# Patient Record
Sex: Female | Born: 1940 | Race: White | Hispanic: No | Marital: Married | Smoking: Never smoker
Health system: Southern US, Community
[De-identification: ages and names within clinical notes are randomized; demographics above are authoritative.]

## PROBLEM LIST (undated history)

## (undated) DIAGNOSIS — E7800 Pure hypercholesterolemia, unspecified: Secondary | ICD-10-CM

## (undated) DIAGNOSIS — I2510 Atherosclerotic heart disease of native coronary artery without angina pectoris: Secondary | ICD-10-CM

## (undated) DIAGNOSIS — K635 Polyp of colon: Secondary | ICD-10-CM

## (undated) DIAGNOSIS — D499 Neoplasm of unspecified behavior of unspecified site: Secondary | ICD-10-CM

## (undated) DIAGNOSIS — I10 Essential (primary) hypertension: Secondary | ICD-10-CM

## (undated) DIAGNOSIS — K219 Gastro-esophageal reflux disease without esophagitis: Secondary | ICD-10-CM

## (undated) DIAGNOSIS — K8020 Calculus of gallbladder without cholecystitis without obstruction: Secondary | ICD-10-CM

## (undated) DIAGNOSIS — R413 Other amnesia: Secondary | ICD-10-CM

## (undated) DIAGNOSIS — R599 Enlarged lymph nodes, unspecified: Secondary | ICD-10-CM

## (undated) HISTORY — DX: Gastro-esophageal reflux disease without esophagitis: K21.9

## (undated) HISTORY — PX: COLONOSCOPY: SHX174

## (undated) HISTORY — DX: Calculus of gallbladder without cholecystitis without obstruction: K80.20

## (undated) HISTORY — DX: Other amnesia: R41.3

## (undated) HISTORY — PX: CARDIAC CATHETERIZATION: SHX172

## (undated) HISTORY — DX: Neoplasm of unspecified behavior of unspecified site: D49.9

## (undated) HISTORY — PX: CORONARY STENT PLACEMENT: SHX1402

## (undated) HISTORY — DX: Atherosclerotic heart disease of native coronary artery without angina pectoris: I25.10

## (undated) HISTORY — DX: Polyp of colon: K63.5

## (undated) HISTORY — DX: Essential (primary) hypertension: I10

## (undated) HISTORY — PX: UPPER GI ENDOSCOPY: SHX6162

## (undated) HISTORY — DX: Enlarged lymph nodes, unspecified: R59.9

## (undated) HISTORY — DX: Pure hypercholesterolemia, unspecified: E78.00

---

## 1978-01-23 HISTORY — PX: ABDOMINAL HYSTERECTOMY: SHX81

## 2004-01-24 DIAGNOSIS — D499 Neoplasm of unspecified behavior of unspecified site: Secondary | ICD-10-CM

## 2004-01-24 HISTORY — DX: Neoplasm of unspecified behavior of unspecified site: D49.9

## 2007-02-20 ENCOUNTER — Ambulatory Visit: Payer: Self-pay

## 2008-01-24 HISTORY — PX: CARDIAC SURGERY: SHX584

## 2008-04-29 ENCOUNTER — Ambulatory Visit: Payer: Self-pay | Admitting: Vascular Surgery

## 2008-05-28 ENCOUNTER — Ambulatory Visit: Payer: Self-pay | Admitting: Cardiology

## 2009-01-23 HISTORY — PX: CAROTID ENDARTERECTOMY: SUR193

## 2009-06-29 ENCOUNTER — Ambulatory Visit: Payer: Self-pay | Admitting: Vascular Surgery

## 2009-07-05 ENCOUNTER — Ambulatory Visit: Payer: Self-pay | Admitting: Vascular Surgery

## 2009-07-06 ENCOUNTER — Inpatient Hospital Stay: Payer: Self-pay | Admitting: Vascular Surgery

## 2010-11-30 ENCOUNTER — Ambulatory Visit: Payer: Self-pay | Admitting: Gynecologic Oncology

## 2010-12-06 ENCOUNTER — Ambulatory Visit: Payer: Self-pay | Admitting: Emergency Medicine

## 2010-12-09 LAB — PATHOLOGY REPORT

## 2010-12-20 ENCOUNTER — Telehealth: Payer: Self-pay

## 2010-12-20 ENCOUNTER — Ambulatory Visit: Payer: Self-pay | Admitting: Gynecologic Oncology

## 2010-12-20 DIAGNOSIS — K3189 Other diseases of stomach and duodenum: Secondary | ICD-10-CM

## 2010-12-20 NOTE — Telephone Encounter (Signed)
Pt has been instructed and meds reviewed pt is currently holding her plavix she has been off for two weeks.

## 2010-12-22 ENCOUNTER — Ambulatory Visit: Payer: Self-pay

## 2010-12-22 ENCOUNTER — Telehealth: Payer: Self-pay | Admitting: Gastroenterology

## 2010-12-22 ENCOUNTER — Encounter: Payer: Self-pay | Admitting: Gastroenterology

## 2010-12-22 NOTE — Telephone Encounter (Signed)
Pt husband called and all questions were answered regarding her medications

## 2010-12-23 ENCOUNTER — Encounter: Payer: Self-pay | Admitting: Gastroenterology

## 2010-12-24 ENCOUNTER — Ambulatory Visit: Payer: Self-pay | Admitting: Gynecologic Oncology

## 2011-01-02 ENCOUNTER — Ambulatory Visit: Payer: Self-pay | Admitting: Gynecologic Oncology

## 2011-01-03 ENCOUNTER — Ambulatory Visit: Payer: Self-pay | Admitting: Gynecologic Oncology

## 2011-01-05 LAB — PATHOLOGY REPORT

## 2011-01-24 ENCOUNTER — Ambulatory Visit: Payer: Self-pay | Admitting: Gynecologic Oncology

## 2011-01-24 DIAGNOSIS — R599 Enlarged lymph nodes, unspecified: Secondary | ICD-10-CM

## 2011-01-24 DIAGNOSIS — I10 Essential (primary) hypertension: Secondary | ICD-10-CM

## 2011-01-24 HISTORY — DX: Essential (primary) hypertension: I10

## 2011-01-24 HISTORY — DX: Enlarged lymph nodes, unspecified: R59.9

## 2011-02-08 DIAGNOSIS — E782 Mixed hyperlipidemia: Secondary | ICD-10-CM | POA: Diagnosis not present

## 2011-02-08 DIAGNOSIS — I2510 Atherosclerotic heart disease of native coronary artery without angina pectoris: Secondary | ICD-10-CM | POA: Diagnosis not present

## 2011-02-08 DIAGNOSIS — I10 Essential (primary) hypertension: Secondary | ICD-10-CM | POA: Diagnosis not present

## 2011-03-07 ENCOUNTER — Ambulatory Visit: Payer: Self-pay | Admitting: Gynecologic Oncology

## 2011-03-07 DIAGNOSIS — D279 Benign neoplasm of unspecified ovary: Secondary | ICD-10-CM | POA: Diagnosis not present

## 2011-03-24 ENCOUNTER — Ambulatory Visit: Payer: Self-pay | Admitting: Gynecologic Oncology

## 2011-05-04 DIAGNOSIS — E785 Hyperlipidemia, unspecified: Secondary | ICD-10-CM | POA: Diagnosis not present

## 2011-05-04 DIAGNOSIS — E039 Hypothyroidism, unspecified: Secondary | ICD-10-CM | POA: Diagnosis not present

## 2011-05-04 DIAGNOSIS — I10 Essential (primary) hypertension: Secondary | ICD-10-CM | POA: Diagnosis not present

## 2011-05-04 DIAGNOSIS — F411 Generalized anxiety disorder: Secondary | ICD-10-CM | POA: Diagnosis not present

## 2011-05-04 DIAGNOSIS — E7800 Pure hypercholesterolemia, unspecified: Secondary | ICD-10-CM | POA: Diagnosis not present

## 2011-05-15 DIAGNOSIS — L570 Actinic keratosis: Secondary | ICD-10-CM | POA: Diagnosis not present

## 2011-05-15 DIAGNOSIS — K130 Diseases of lips: Secondary | ICD-10-CM | POA: Diagnosis not present

## 2011-06-01 DIAGNOSIS — L570 Actinic keratosis: Secondary | ICD-10-CM | POA: Diagnosis not present

## 2011-06-08 DIAGNOSIS — F329 Major depressive disorder, single episode, unspecified: Secondary | ICD-10-CM | POA: Diagnosis not present

## 2011-06-08 DIAGNOSIS — F411 Generalized anxiety disorder: Secondary | ICD-10-CM | POA: Diagnosis not present

## 2011-07-18 DIAGNOSIS — L049 Acute lymphadenitis, unspecified: Secondary | ICD-10-CM | POA: Diagnosis not present

## 2011-07-31 DIAGNOSIS — E782 Mixed hyperlipidemia: Secondary | ICD-10-CM | POA: Diagnosis not present

## 2011-07-31 DIAGNOSIS — I10 Essential (primary) hypertension: Secondary | ICD-10-CM | POA: Diagnosis not present

## 2011-07-31 DIAGNOSIS — I2510 Atherosclerotic heart disease of native coronary artery without angina pectoris: Secondary | ICD-10-CM | POA: Diagnosis not present

## 2011-08-01 DIAGNOSIS — E039 Hypothyroidism, unspecified: Secondary | ICD-10-CM | POA: Diagnosis not present

## 2011-08-01 DIAGNOSIS — M719 Bursopathy, unspecified: Secondary | ICD-10-CM | POA: Diagnosis not present

## 2011-08-01 DIAGNOSIS — M67919 Unspecified disorder of synovium and tendon, unspecified shoulder: Secondary | ICD-10-CM | POA: Diagnosis not present

## 2011-08-01 DIAGNOSIS — L049 Acute lymphadenitis, unspecified: Secondary | ICD-10-CM | POA: Diagnosis not present

## 2011-08-07 DIAGNOSIS — R0789 Other chest pain: Secondary | ICD-10-CM | POA: Diagnosis not present

## 2011-10-06 DIAGNOSIS — L049 Acute lymphadenitis, unspecified: Secondary | ICD-10-CM | POA: Diagnosis not present

## 2011-10-17 DIAGNOSIS — R599 Enlarged lymph nodes, unspecified: Secondary | ICD-10-CM | POA: Diagnosis not present

## 2012-02-12 DIAGNOSIS — I2510 Atherosclerotic heart disease of native coronary artery without angina pectoris: Secondary | ICD-10-CM | POA: Diagnosis not present

## 2012-02-12 DIAGNOSIS — I10 Essential (primary) hypertension: Secondary | ICD-10-CM | POA: Diagnosis not present

## 2012-03-12 DIAGNOSIS — I10 Essential (primary) hypertension: Secondary | ICD-10-CM | POA: Diagnosis not present

## 2012-03-12 DIAGNOSIS — F411 Generalized anxiety disorder: Secondary | ICD-10-CM | POA: Diagnosis not present

## 2012-03-12 DIAGNOSIS — Z0000 Encounter for general adult medical examination without abnormal findings: Secondary | ICD-10-CM | POA: Diagnosis not present

## 2012-03-12 DIAGNOSIS — E785 Hyperlipidemia, unspecified: Secondary | ICD-10-CM | POA: Diagnosis not present

## 2012-03-15 ENCOUNTER — Encounter: Payer: Self-pay | Admitting: *Deleted

## 2012-03-15 DIAGNOSIS — I10 Essential (primary) hypertension: Secondary | ICD-10-CM | POA: Insufficient documentation

## 2012-03-15 DIAGNOSIS — E7800 Pure hypercholesterolemia, unspecified: Secondary | ICD-10-CM | POA: Insufficient documentation

## 2012-03-18 DIAGNOSIS — R319 Hematuria, unspecified: Secondary | ICD-10-CM | POA: Diagnosis not present

## 2012-03-18 DIAGNOSIS — Z1231 Encounter for screening mammogram for malignant neoplasm of breast: Secondary | ICD-10-CM | POA: Diagnosis not present

## 2012-03-22 DIAGNOSIS — D126 Benign neoplasm of colon, unspecified: Secondary | ICD-10-CM | POA: Diagnosis not present

## 2012-03-22 DIAGNOSIS — D131 Benign neoplasm of stomach: Secondary | ICD-10-CM | POA: Diagnosis not present

## 2012-03-22 DIAGNOSIS — R634 Abnormal weight loss: Secondary | ICD-10-CM | POA: Diagnosis not present

## 2012-04-02 ENCOUNTER — Ambulatory Visit: Payer: Self-pay | Admitting: Emergency Medicine

## 2012-04-02 DIAGNOSIS — K2990 Gastroduodenitis, unspecified, without bleeding: Secondary | ICD-10-CM | POA: Diagnosis not present

## 2012-04-02 DIAGNOSIS — Z7901 Long term (current) use of anticoagulants: Secondary | ICD-10-CM | POA: Diagnosis not present

## 2012-04-02 DIAGNOSIS — D126 Benign neoplasm of colon, unspecified: Secondary | ICD-10-CM | POA: Diagnosis not present

## 2012-04-02 DIAGNOSIS — R1909 Other intra-abdominal and pelvic swelling, mass and lump: Secondary | ICD-10-CM | POA: Diagnosis not present

## 2012-04-02 DIAGNOSIS — K2940 Chronic atrophic gastritis without bleeding: Secondary | ICD-10-CM | POA: Diagnosis not present

## 2012-04-02 DIAGNOSIS — Z809 Family history of malignant neoplasm, unspecified: Secondary | ICD-10-CM | POA: Diagnosis not present

## 2012-04-02 DIAGNOSIS — D131 Benign neoplasm of stomach: Secondary | ICD-10-CM | POA: Diagnosis not present

## 2012-04-02 DIAGNOSIS — Z7982 Long term (current) use of aspirin: Secondary | ICD-10-CM | POA: Diagnosis not present

## 2012-04-02 DIAGNOSIS — A048 Other specified bacterial intestinal infections: Secondary | ICD-10-CM | POA: Diagnosis not present

## 2012-04-02 DIAGNOSIS — C169 Malignant neoplasm of stomach, unspecified: Secondary | ICD-10-CM | POA: Diagnosis not present

## 2012-04-02 DIAGNOSIS — Z9861 Coronary angioplasty status: Secondary | ICD-10-CM | POA: Diagnosis not present

## 2012-04-02 DIAGNOSIS — Z86010 Personal history of colonic polyps: Secondary | ICD-10-CM | POA: Diagnosis not present

## 2012-04-02 DIAGNOSIS — Z8249 Family history of ischemic heart disease and other diseases of the circulatory system: Secondary | ICD-10-CM | POA: Diagnosis not present

## 2012-04-02 DIAGNOSIS — I10 Essential (primary) hypertension: Secondary | ICD-10-CM | POA: Diagnosis not present

## 2012-04-04 DIAGNOSIS — H2510 Age-related nuclear cataract, unspecified eye: Secondary | ICD-10-CM | POA: Diagnosis not present

## 2012-04-04 DIAGNOSIS — H524 Presbyopia: Secondary | ICD-10-CM | POA: Diagnosis not present

## 2012-04-12 DIAGNOSIS — J309 Allergic rhinitis, unspecified: Secondary | ICD-10-CM | POA: Diagnosis not present

## 2012-04-12 DIAGNOSIS — B009 Herpesviral infection, unspecified: Secondary | ICD-10-CM | POA: Diagnosis not present

## 2012-04-12 DIAGNOSIS — F329 Major depressive disorder, single episode, unspecified: Secondary | ICD-10-CM | POA: Diagnosis not present

## 2012-04-12 DIAGNOSIS — R319 Hematuria, unspecified: Secondary | ICD-10-CM | POA: Diagnosis not present

## 2012-05-08 ENCOUNTER — Encounter: Payer: Self-pay | Admitting: General Surgery

## 2012-05-08 ENCOUNTER — Ambulatory Visit (INDEPENDENT_AMBULATORY_CARE_PROVIDER_SITE_OTHER): Payer: Medicare Other | Admitting: General Surgery

## 2012-05-08 VITALS — BP 110/62 | HR 68 | Resp 12 | Ht 62.0 in | Wt 98.0 lb

## 2012-05-08 DIAGNOSIS — R599 Enlarged lymph nodes, unspecified: Secondary | ICD-10-CM

## 2012-05-08 DIAGNOSIS — K429 Umbilical hernia without obstruction or gangrene: Secondary | ICD-10-CM | POA: Insufficient documentation

## 2012-05-08 NOTE — Patient Instructions (Addendum)
Patient advised to return in as needed. If she sees or feel the lymph node getting larger to give our office a call.

## 2012-05-08 NOTE — Progress Notes (Signed)
Patient ID: Christine Burch, female   DOB: [birth year], 72 y.o.   MRN: [id number]  Chief Complaint  Patient presents with  . Follow-up    3 month neck node check    HPI Christine Burch is a 72 y.o. female who presents for a 3 month follow up of right enlarged lymph node. The patient states the area is not as sore as it has been but is still a little tender. The tenderness comes and goes and is only noticed when she touches the area. She states the area is larger at times. She reframes from touching the area to prevent anymore tenderness. Previous evaluations have been essentially normal with ultrasound showing and a small lymph node in the upper neck on the right HPI  Past Medical History  Diagnosis Date  . Hypertension 2013  . Enlargement of lymph nodes 2013  . High cholesterol   . Acid reflux   . Tumors 2006    Past Surgical History  Procedure Laterality Date  . Carotid endarterectomy Right 2011  . Colonoscopy  2012    Dr.Hashmi  . Upper gi endoscopy  2012  . Cardiac surgery  2010    stint  . Abdominal hysterectomy  1980    History reviewed. No pertinent family history.  Social History History  Substance Use Topics  . Smoking status: Never Smoker   . Smokeless tobacco: Not on file  . Alcohol Use: No    No Known Allergies  Current Outpatient Prescriptions  Medication Sig Dispense Refill  . aspirin 81 MG tablet Take 81 mg by mouth 2 (two) times daily.       . clopidogrel (PLAVIX) 75 MG tablet Take 75 mg by mouth daily.      Marland Kitchen ezetimibe (ZETIA) 10 MG tablet Take 10 mg by mouth daily.      . hydrochlorothiazide (HYDRODIURIL) 25 MG tablet Take 25 mg by mouth daily.      . ranitidine (ZANTAC) 150 MG capsule Take 150 mg by mouth 2 (two) times daily.      . rosuvastatin (CRESTOR) 40 MG tablet Take 40 mg by mouth daily.       No current facility-administered medications for this visit.    Review of Systems Review of Systems  Constitutional: Negative.   Respiratory:  Negative.   Cardiovascular: Negative.     Blood pressure 110/62, pulse 68, resp. rate 12, height 5\' 2"  (1.575 m), weight 98 lb (44.453 kg).  Physical Exam Physical Exam  Constitutional: She appears well-developed and well-nourished.  Neck: Trachea normal. No mass and no thyromegaly present.  Posterior of the right mandible ill define firm mass. This is the upper end of prior incision for carotid.  Abdominal: Soft. Normal appearance and bowel sounds are normal. There is no hepatosplenomegaly. There is no tenderness. No hernia.  Lymphadenopathy:    She has no cervical adenopathy.    She has no axillary adenopathy.    Data Reviewed Ultrasound was performed here and still remains essentially normal except for a small benign-appearing lymph node in upper neck on the right side  Assessment    Patient is reassured. We'll recheck on a when necessary basis    Plan    F/u prn        SANKAR,SEEPLAPUTHUR G 05/13/2012, 8:55 AM

## 2012-05-12 ENCOUNTER — Encounter: Payer: Self-pay | Admitting: General Surgery

## 2012-05-13 ENCOUNTER — Encounter: Payer: Self-pay | Admitting: General Surgery

## 2012-08-22 DIAGNOSIS — I10 Essential (primary) hypertension: Secondary | ICD-10-CM | POA: Diagnosis not present

## 2012-08-22 DIAGNOSIS — I2510 Atherosclerotic heart disease of native coronary artery without angina pectoris: Secondary | ICD-10-CM | POA: Diagnosis not present

## 2012-08-22 DIAGNOSIS — E782 Mixed hyperlipidemia: Secondary | ICD-10-CM | POA: Diagnosis not present

## 2012-09-30 DIAGNOSIS — E785 Hyperlipidemia, unspecified: Secondary | ICD-10-CM | POA: Diagnosis not present

## 2012-09-30 DIAGNOSIS — I10 Essential (primary) hypertension: Secondary | ICD-10-CM | POA: Diagnosis not present

## 2012-10-15 ENCOUNTER — Encounter: Payer: Self-pay | Admitting: General Surgery

## 2012-10-15 ENCOUNTER — Ambulatory Visit (INDEPENDENT_AMBULATORY_CARE_PROVIDER_SITE_OTHER): Payer: Medicare Other | Admitting: General Surgery

## 2012-10-15 VITALS — BP 138/70 | HR 76 | Resp 12 | Ht 62.0 in | Wt 101.0 lb

## 2012-10-15 DIAGNOSIS — R220 Localized swelling, mass and lump, head: Secondary | ICD-10-CM | POA: Diagnosis not present

## 2012-10-15 DIAGNOSIS — R221 Localized swelling, mass and lump, neck: Secondary | ICD-10-CM | POA: Insufficient documentation

## 2012-10-15 NOTE — Patient Instructions (Addendum)
The patient is aware to call back for any questions or concerns.  

## 2012-10-15 NOTE — Progress Notes (Signed)
Patient ID: Christine Burch, female   DOB: [birth year], 72 y.o.   MRN: [id number]  Chief Complaint  Patient presents with  . Other    neck node    HPI Christine Burch is a 72 y.o. female here today for a recheck on her right neck node. No new changes, occasional soreness. She states she can't feel it "all the time" it seems to "come and go". HPI  Past Medical History  Diagnosis Date  . Hypertension 2013  . Enlargement of lymph nodes 2013  . High cholesterol   . Acid reflux   . Tumors 2006    Past Surgical History  Procedure Laterality Date  . Carotid endarterectomy Right 2011  . Colonoscopy  2012    Dr.Hashmi  . Upper gi endoscopy  2012  . Cardiac surgery  2010    stint  . Abdominal hysterectomy  1980    No family history on file.  Social History History  Substance Use Topics  . Smoking status: Never Smoker   . Smokeless tobacco: Not on file  . Alcohol Use: No    No Known Allergies  Current Outpatient Prescriptions  Medication Sig Dispense Refill  . aspirin 81 MG tablet Take 81 mg by mouth 2 (two) times daily.       . clopidogrel (PLAVIX) 75 MG tablet Take 75 mg by mouth daily.      Marland Kitchen ezetimibe (ZETIA) 10 MG tablet Take 10 mg by mouth daily.      . hydrochlorothiazide (HYDRODIURIL) 25 MG tablet Take 25 mg by mouth daily.      . ranitidine (ZANTAC) 150 MG capsule Take 150 mg by mouth 2 (two) times daily.      . rosuvastatin (CRESTOR) 40 MG tablet Take 40 mg by mouth daily.       No current facility-administered medications for this visit.    Review of Systems Review of Systems  Constitutional: Negative.   Respiratory: Negative.   Cardiovascular: Negative.     Blood pressure 138/70, pulse 76, resp. rate 12, height 5\' 2"  (1.575 m), weight 101 lb (45.813 kg).  Physical Exam Physical Exam  Constitutional: She is oriented to person, place, and time. She appears well-developed and well-nourished.  Eyes: Conjunctivae are normal. No scleral icterus.  Neck: No mass  and no thyromegaly present.    Lymphadenopathy:    She has no cervical adenopathy.  Neurological: She is alert and oriented to person, place, and time.  Skin: Skin is warm and dry.    Data Reviewed Previous ultrasound.  Assessment    Stable. Reassured that she has no palpable abnormality. Behavior of a lump showing up intermittently is a sign of benign process.    Plan    Return as needed.       Christine Burch 10/15/2012, 1:01 PM

## 2012-10-16 DIAGNOSIS — R7989 Other specified abnormal findings of blood chemistry: Secondary | ICD-10-CM | POA: Diagnosis not present

## 2012-12-31 DIAGNOSIS — I10 Essential (primary) hypertension: Secondary | ICD-10-CM | POA: Diagnosis not present

## 2012-12-31 DIAGNOSIS — E785 Hyperlipidemia, unspecified: Secondary | ICD-10-CM | POA: Diagnosis not present

## 2013-01-30 DIAGNOSIS — I10 Essential (primary) hypertension: Secondary | ICD-10-CM | POA: Diagnosis not present

## 2013-01-31 DIAGNOSIS — I10 Essential (primary) hypertension: Secondary | ICD-10-CM | POA: Diagnosis not present

## 2013-01-31 DIAGNOSIS — E782 Mixed hyperlipidemia: Secondary | ICD-10-CM | POA: Diagnosis not present

## 2013-01-31 DIAGNOSIS — I2510 Atherosclerotic heart disease of native coronary artery without angina pectoris: Secondary | ICD-10-CM | POA: Diagnosis not present

## 2013-04-01 DIAGNOSIS — H40019 Open angle with borderline findings, low risk, unspecified eye: Secondary | ICD-10-CM | POA: Diagnosis not present

## 2013-04-01 DIAGNOSIS — H524 Presbyopia: Secondary | ICD-10-CM | POA: Diagnosis not present

## 2013-04-30 DIAGNOSIS — I10 Essential (primary) hypertension: Secondary | ICD-10-CM | POA: Diagnosis not present

## 2013-04-30 DIAGNOSIS — E876 Hypokalemia: Secondary | ICD-10-CM | POA: Diagnosis not present

## 2013-04-30 DIAGNOSIS — R634 Abnormal weight loss: Secondary | ICD-10-CM | POA: Diagnosis not present

## 2013-04-30 DIAGNOSIS — R319 Hematuria, unspecified: Secondary | ICD-10-CM | POA: Diagnosis not present

## 2013-04-30 DIAGNOSIS — E785 Hyperlipidemia, unspecified: Secondary | ICD-10-CM | POA: Diagnosis not present

## 2013-05-08 DIAGNOSIS — Z1231 Encounter for screening mammogram for malignant neoplasm of breast: Secondary | ICD-10-CM | POA: Diagnosis not present

## 2013-08-04 DIAGNOSIS — Z9889 Other specified postprocedural states: Secondary | ICD-10-CM | POA: Diagnosis not present

## 2013-08-04 DIAGNOSIS — I10 Essential (primary) hypertension: Secondary | ICD-10-CM | POA: Diagnosis not present

## 2013-08-04 DIAGNOSIS — E785 Hyperlipidemia, unspecified: Secondary | ICD-10-CM | POA: Diagnosis not present

## 2013-08-13 DIAGNOSIS — R636 Underweight: Secondary | ICD-10-CM | POA: Diagnosis not present

## 2013-08-13 DIAGNOSIS — R3129 Other microscopic hematuria: Secondary | ICD-10-CM | POA: Diagnosis not present

## 2013-08-13 DIAGNOSIS — I129 Hypertensive chronic kidney disease with stage 1 through stage 4 chronic kidney disease, or unspecified chronic kidney disease: Secondary | ICD-10-CM | POA: Diagnosis not present

## 2013-08-13 DIAGNOSIS — I2510 Atherosclerotic heart disease of native coronary artery without angina pectoris: Secondary | ICD-10-CM | POA: Diagnosis not present

## 2013-08-18 ENCOUNTER — Ambulatory Visit (INDEPENDENT_AMBULATORY_CARE_PROVIDER_SITE_OTHER): Payer: Medicare Other | Admitting: General Surgery

## 2013-08-18 ENCOUNTER — Encounter: Payer: Self-pay | Admitting: General Surgery

## 2013-08-18 VITALS — BP 138/80 | HR 78 | Resp 12 | Ht 62.0 in | Wt 103.0 lb

## 2013-08-18 DIAGNOSIS — K319 Disease of stomach and duodenum, unspecified: Secondary | ICD-10-CM | POA: Diagnosis not present

## 2013-08-18 DIAGNOSIS — K3189 Other diseases of stomach and duodenum: Secondary | ICD-10-CM

## 2013-08-18 NOTE — Patient Instructions (Signed)
Patient to return in March 2016 for follow up. The patient is aware to call back for any questions or concerns.

## 2013-08-18 NOTE — Progress Notes (Signed)
Patient ID: Christine Burch, female   DOB: 1940/04/15, 73 y.o.   MRN: [id number]  Chief Complaint  Patient presents with  . Other    stomach mass    HPI Christine Burch is a 73 y.o. female here today for a evaluation of a stomach mass. Patient was seen by Dr. Phylis Bougie. She denies any problems with her stomach. She states this has been evaluated yearly over the last 3 years for this issue. In 2012 she had an endoscopy showing a small submucous mass in proximal stomach. Reportedly this was evaluated with ultrasound in 2013. The mass has stayed stable in size with last endoscopy in 2014. Patient has H-Pylori by previous biopsy and has been treated for this.   HPI  Past Medical History  Diagnosis Date  . Hypertension 2013  . Enlargement of lymph nodes 2013  . High cholesterol   . Acid reflux   . Tumors 2006    Past Surgical History  Procedure Laterality Date  . Carotid endarterectomy Right 2011  . Colonoscopy  7169,6789    Dr.Hashmi  . Upper gi endoscopy  2012  . Cardiac surgery  2010    stint  . Abdominal hysterectomy  1980    History reviewed. No pertinent family history.  Social History History  Substance Use Topics  . Smoking status: Never Smoker   . Smokeless tobacco: Not on file  . Alcohol Use: No    No Known Allergies  Current Outpatient Prescriptions  Medication Sig Dispense Refill  . aspirin 81 MG tablet Take 81 mg by mouth 2 (two) times daily.       . clopidogrel (PLAVIX) 75 MG tablet Take 75 mg by mouth daily.      Marland Kitchen ezetimibe (ZETIA) 10 MG tablet Take 10 mg by mouth daily.      Marland Kitchen losartan (COZAAR) 25 MG tablet Take 25 mg by mouth daily.      . ranitidine (ZANTAC) 150 MG capsule Take 150 mg by mouth 2 (two) times daily.      . rosuvastatin (CRESTOR) 40 MG tablet Take 40 mg by mouth daily.       No current facility-administered medications for this visit.    Review of Systems Review of Systems  Constitutional: Negative.   Respiratory: Negative.    Cardiovascular: Negative.     Blood pressure 138/80, pulse 78, resp. rate 12, height 5\' 2"  (1.575 m), weight 103 lb (46.72 kg).  Physical Exam Physical Exam  Constitutional: She is oriented to person, place, and time. She appears well-developed and well-nourished.  Eyes: Conjunctivae are normal. No scleral icterus.  Neck: Neck supple. No thyromegaly present.  Cardiovascular: Normal rate, regular rhythm and normal heart sounds.   No murmur heard. Pulmonary/Chest: Effort normal and breath sounds normal.  Abdominal: Soft. Normal appearance and bowel sounds are normal. There is no hepatosplenomegaly. There is no tenderness. No hernia.  Lymphadenopathy:    She has no cervical adenopathy.  Neurological: She is alert and oriented to person, place, and time.  Skin: Skin is warm and dry.    Data Reviewed  Previous endoscopy reports and pathology reports.   Assessment    Small submucous mass in stomach which has been stable for 3 years. Do not feel she needs a repeat endoscopy this year.     Plan   Patient to return in March 2016 for follow up. We will arrange an endoscopy at that time.        Christine Burch F 08/18/2013, 10:34  AM    

## 2013-08-20 ENCOUNTER — Encounter: Payer: Self-pay | Admitting: General Surgery

## 2013-09-10 DIAGNOSIS — R3129 Other microscopic hematuria: Secondary | ICD-10-CM | POA: Diagnosis not present

## 2013-09-10 DIAGNOSIS — N952 Postmenopausal atrophic vaginitis: Secondary | ICD-10-CM | POA: Diagnosis not present

## 2013-09-18 ENCOUNTER — Ambulatory Visit: Payer: Self-pay | Admitting: Urology

## 2013-09-18 DIAGNOSIS — D131 Benign neoplasm of stomach: Secondary | ICD-10-CM | POA: Diagnosis not present

## 2013-09-18 DIAGNOSIS — K8020 Calculus of gallbladder without cholecystitis without obstruction: Secondary | ICD-10-CM | POA: Diagnosis not present

## 2013-09-18 DIAGNOSIS — R319 Hematuria, unspecified: Secondary | ICD-10-CM | POA: Diagnosis not present

## 2013-09-18 DIAGNOSIS — N281 Cyst of kidney, acquired: Secondary | ICD-10-CM | POA: Diagnosis not present

## 2013-09-26 DIAGNOSIS — R319 Hematuria, unspecified: Secondary | ICD-10-CM | POA: Diagnosis not present

## 2013-10-14 DIAGNOSIS — H40019 Open angle with borderline findings, low risk, unspecified eye: Secondary | ICD-10-CM | POA: Diagnosis not present

## 2013-11-24 ENCOUNTER — Encounter: Payer: Self-pay | Admitting: General Surgery

## 2013-12-26 DIAGNOSIS — R312 Other microscopic hematuria: Secondary | ICD-10-CM | POA: Diagnosis not present

## 2013-12-26 DIAGNOSIS — K319 Disease of stomach and duodenum, unspecified: Secondary | ICD-10-CM | POA: Diagnosis not present

## 2014-01-29 ENCOUNTER — Encounter: Payer: Self-pay | Admitting: General Surgery

## 2014-01-29 ENCOUNTER — Ambulatory Visit (INDEPENDENT_AMBULATORY_CARE_PROVIDER_SITE_OTHER): Payer: Medicare Other | Admitting: General Surgery

## 2014-01-29 VITALS — BP 122/68 | HR 78 | Resp 12 | Ht 62.0 in | Wt 102.0 lb

## 2014-01-29 DIAGNOSIS — K319 Disease of stomach and duodenum, unspecified: Secondary | ICD-10-CM | POA: Diagnosis not present

## 2014-01-29 DIAGNOSIS — K3189 Other diseases of stomach and duodenum: Secondary | ICD-10-CM

## 2014-01-29 NOTE — Patient Instructions (Signed)
Patient to return in 1 year for follow up.

## 2014-01-29 NOTE — Progress Notes (Signed)
Patient ID: Christine Burch, female   DOB: 1940/10/12, 74 y.o.   MRN: [id number]  Chief Complaint  Patient presents with  . Follow-up    evaluation of stomach mass    HPI Christine Burch is a 74 y.o. female who presents for an evaluation of a stomach mass. The patient had a CT scan of the abdomen/pelvis on 09/18/13. This was part of evaluation for hematuria.  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  No new complaints at this time. Pt denies any abdominal symptoms.  HPI  Past Medical History  Diagnosis Date  . Hypertension 2013  . Enlargement of lymph nodes 2013  . High cholesterol   . Acid reflux   . Tumors 2006    Past Surgical History  Procedure Laterality Date  . Carotid endarterectomy Right 2011  . Colonoscopy  8127,5170    Dr.Hashmi  . Upper gi endoscopy  2012  . Cardiac surgery  2010    stint  . Abdominal hysterectomy  1980    History reviewed. No pertinent family history.  Social History History  Substance Use Topics  . Smoking status: Never Smoker   . Smokeless tobacco: Not on file  . Alcohol Use: No    No Known Allergies  Current Outpatient Prescriptions  Medication Sig Dispense Refill  . aspirin 81 MG tablet Take 81 mg by mouth 2 (two) times daily.     . clopidogrel (PLAVIX) 75 MG tablet Take 75 mg by mouth daily.    Marland Kitchen ezetimibe (ZETIA) 10 MG tablet Take 10 mg by mouth daily.    Marland Kitchen losartan (COZAAR) 25 MG tablet Take 25 mg by mouth daily.    . ranitidine (ZANTAC) 150 MG capsule Take 150 mg by mouth 2 (two) times daily.    . rosuvastatin (CRESTOR) 40 MG tablet Take 40 mg by mouth daily.     No current facility-administered medications for this visit.    Review of Systems Review of Systems  Constitutional: Negative.   Respiratory: Negative.   Cardiovascular: Negative.   Gastrointestinal: Negative.     Blood pressure 122/68, pulse 78, resp. rate 12, height 5\' 2"  (1.575 m), weight 102 lb (46.267 kg).  Physical Exam Physical  Exam Not performed as pt was recently seen.  Data Reviewed Prior endoscopies and recent CT  Assessment    Based on recent Ct the gastric wall mass is unchanged, measuring 2 cm in size. 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 She needs a f/u endoscopy in 2017, last one done in 2014. She also needs a colonoscopy in 2017 for prior polyp. Discussed fully with pt and she will return in 1 yr for schduling    Plan    Return in 1 yr for surveillance endoscopy and colonoscopy.      Cc: PCP and referring MD  [name] 01/30/2014, 5:35 AM

## 2014-01-30 ENCOUNTER — Encounter: Payer: Self-pay | Admitting: General Surgery

## 2014-02-04 DIAGNOSIS — Z9889 Other specified postprocedural states: Secondary | ICD-10-CM | POA: Diagnosis not present

## 2014-02-04 DIAGNOSIS — I10 Essential (primary) hypertension: Secondary | ICD-10-CM | POA: Diagnosis not present

## 2014-02-04 DIAGNOSIS — I2510 Atherosclerotic heart disease of native coronary artery without angina pectoris: Secondary | ICD-10-CM | POA: Diagnosis not present

## 2014-02-04 DIAGNOSIS — E780 Pure hypercholesterolemia: Secondary | ICD-10-CM | POA: Diagnosis not present

## 2014-02-25 DIAGNOSIS — E785 Hyperlipidemia, unspecified: Secondary | ICD-10-CM | POA: Diagnosis not present

## 2014-02-25 DIAGNOSIS — I129 Hypertensive chronic kidney disease with stage 1 through stage 4 chronic kidney disease, or unspecified chronic kidney disease: Secondary | ICD-10-CM | POA: Diagnosis not present

## 2014-02-25 DIAGNOSIS — N183 Chronic kidney disease, stage 3 (moderate): Secondary | ICD-10-CM | POA: Diagnosis not present

## 2014-02-25 DIAGNOSIS — E876 Hypokalemia: Secondary | ICD-10-CM | POA: Diagnosis not present

## 2014-02-25 DIAGNOSIS — I2510 Atherosclerotic heart disease of native coronary artery without angina pectoris: Secondary | ICD-10-CM | POA: Diagnosis not present

## 2014-02-25 DIAGNOSIS — I10 Essential (primary) hypertension: Secondary | ICD-10-CM | POA: Diagnosis not present

## 2014-03-10 DIAGNOSIS — I2510 Atherosclerotic heart disease of native coronary artery without angina pectoris: Secondary | ICD-10-CM | POA: Diagnosis not present

## 2014-03-17 DIAGNOSIS — E780 Pure hypercholesterolemia: Secondary | ICD-10-CM | POA: Diagnosis not present

## 2014-03-17 DIAGNOSIS — I679 Cerebrovascular disease, unspecified: Secondary | ICD-10-CM | POA: Diagnosis not present

## 2014-03-17 DIAGNOSIS — I10 Essential (primary) hypertension: Secondary | ICD-10-CM | POA: Diagnosis not present

## 2014-03-17 DIAGNOSIS — Z9889 Other specified postprocedural states: Secondary | ICD-10-CM | POA: Diagnosis not present

## 2014-04-14 DIAGNOSIS — H40013 Open angle with borderline findings, low risk, bilateral: Secondary | ICD-10-CM | POA: Diagnosis not present

## 2014-04-14 DIAGNOSIS — H524 Presbyopia: Secondary | ICD-10-CM | POA: Diagnosis not present

## 2014-08-31 ENCOUNTER — Ambulatory Visit (INDEPENDENT_AMBULATORY_CARE_PROVIDER_SITE_OTHER): Payer: Medicare Other | Admitting: General Surgery

## 2014-08-31 ENCOUNTER — Encounter: Payer: Self-pay | Admitting: General Surgery

## 2014-08-31 VITALS — BP 110/66 | HR 78 | Resp 14 | Ht 62.0 in | Wt 99.6 lb

## 2014-08-31 DIAGNOSIS — N644 Mastodynia: Secondary | ICD-10-CM | POA: Diagnosis not present

## 2014-08-31 DIAGNOSIS — M542 Cervicalgia: Secondary | ICD-10-CM

## 2014-08-31 NOTE — Progress Notes (Signed)
Patient ID: Christine Burch, female   DOB: [birth year], 74 y.o.   MRN: [id number]  Chief Complaint  Patient presents with  . Follow-up    right neck mass    HPI Christine Burch is a 74 y.o. female here for an evaluation of pain on the right side of her neck. She states that this comes and goes and may be absent for several months. She was previously seen for this about 2 years ago. She reports the area is only tender to touch. She denies any swelling in the area.  HPI  Past Medical History  Diagnosis Date  . Hypertension 2013  . Enlargement of lymph nodes 2013  . High cholesterol   . Acid reflux   . Tumors 2006    Past Surgical History  Procedure Laterality Date  . Carotid endarterectomy Right 2011  . Colonoscopy  [id number]    Dr.Hashmi  . Upper gi endoscopy  2012  . Cardiac surgery  2010    stint  . Abdominal hysterectomy  1980    History reviewed. No pertinent family history.  Social History History  Substance Use Topics  . Smoking status: Never Smoker   . Smokeless tobacco: Never Used  . Alcohol Use: No    No Known Allergies  Current Outpatient Prescriptions  Medication Sig Dispense Refill  . aspirin 81 MG tablet Take 81 mg by mouth 2 (two) times daily.     . clopidogrel (PLAVIX) 75 MG tablet Take 75 mg by mouth daily.    Marland Kitchen ezetimibe (ZETIA) 10 MG tablet Take 10 mg by mouth daily.    Marland Kitchen losartan (COZAAR) 25 MG tablet Take 25 mg by mouth daily.    . ranitidine (ZANTAC) 150 MG capsule Take 150 mg by mouth 2 (two) times daily.    . rosuvastatin (CRESTOR) 40 MG tablet Take 40 mg by mouth daily.     No current facility-administered medications for this visit.    Review of Systems Review of Systems  Constitutional: Negative.   Respiratory: Negative.   Cardiovascular: Negative.     Blood pressure 110/66, pulse 78, resp. rate 14, height 5\' 2"  (1.575 m), weight 99 lb 9.6 oz (45.178 kg).  Physical Exam Physical Exam  Constitutional: She is oriented to person, place,  and time. She appears well-developed and well-nourished.  Eyes: Conjunctivae are normal. No scleral icterus.  Neck: Neck supple.  No lymphadenopathy, mass, or tenderenss noted.  Right carotid incision is well healed. No bruit is heard on that side. However, she has a moderate bruit over the left carotid.   Cardiovascular:  Pulses:      Carotid pulses are 2+ on the right side, and 2+ on the left side with bruit.      Radial pulses are 2+ on the right side, and 2+ on the left side.  Lymphadenopathy:    She has no cervical adenopathy.  Neurological: She is alert and oriented to person, place, and time.  Skin: Skin is warm and dry.  Psychiatric: She has a normal mood and affect.    Data Reviewed Prior neck US.   Assessment    No apparent findings to account for her intermittent pain. However, she has not had a carotid duplex in a long time. Will plan to do this.      Plan    Schedule carotid US    PCP:  Alex Gardener 08/31/2014, 1:52 PM

## 2014-09-02 ENCOUNTER — Ambulatory Visit (INDEPENDENT_AMBULATORY_CARE_PROVIDER_SITE_OTHER): Payer: Medicare Other | Admitting: General Surgery

## 2014-09-02 ENCOUNTER — Ambulatory Visit: Payer: Medicare Other

## 2014-09-02 VITALS — BP 120/58 | HR 82 | Resp 14 | Ht 62.0 in | Wt 99.0 lb

## 2014-09-02 DIAGNOSIS — M542 Cervicalgia: Secondary | ICD-10-CM

## 2014-09-02 DIAGNOSIS — I6529 Occlusion and stenosis of unspecified carotid artery: Secondary | ICD-10-CM

## 2014-09-02 NOTE — Patient Instructions (Addendum)
Patient to return in six months left carotid ultrasoind

## 2014-09-02 NOTE — Progress Notes (Signed)
Patient ID: Christine Burch, female   DOB: 1940/02/23, 74 y.o.   MRN: [id number]  Chief Complaint  Patient presents with  . Other    Carotid ultrasound    HPI Christine Burch is a 74 y.o. female here today for a carotid ultrasound. HPI  Past Medical History  Diagnosis Date  . Hypertension 2013  . Enlargement of lymph nodes 2013  . High cholesterol   . Acid reflux   . Tumors 2006    Past Surgical History  Procedure Laterality Date  . Carotid endarterectomy Right 2011  . Colonoscopy  [id number]    Dr.Hashmi  . Upper gi endoscopy  2012  . Cardiac surgery  2010    stint  . Abdominal hysterectomy  1980    No family history on file.  Social History Social History  Substance Use Topics  . Smoking status: Never Smoker   . Smokeless tobacco: Never Used  . Alcohol Use: No    No Known Allergies  Current Outpatient Prescriptions  Medication Sig Dispense Refill  . aspirin 81 MG tablet Take 81 mg by mouth 2 (two) times daily.     . clopidogrel (PLAVIX) 75 MG tablet Take 75 mg by mouth daily.    Marland Kitchen ezetimibe (ZETIA) 10 MG tablet Take 10 mg by mouth daily.    Marland Kitchen losartan (COZAAR) 25 MG tablet Take 25 mg by mouth daily.    . ranitidine (ZANTAC) 150 MG capsule Take 150 mg by mouth 2 (two) times daily.    . rosuvastatin (CRESTOR) 40 MG tablet Take 40 mg by mouth daily.     No current facility-administered medications for this visit.    Review of Systems Review of Systems  Blood pressure 120/58, pulse 82, resp. rate 14, height 5\' 2"  (1.575 m), weight 99 lb (44.906 kg).  Physical Exam Physical Exam  Data Reviewed Duplex study performed today showing mild to moderate plaque in left carotid affecting the ICA. Estimated stenosis 25-50% No enlarged node or mass on right Assessment    Left carotid artery plaque with stenosis      Plan    Patient to return in six months for left carotid ultrasound     [facility] Patient:  [facility] PCP:  Alex Gardener 09/02/2014, 3:48 PM

## 2014-09-07 ENCOUNTER — Encounter: Payer: Self-pay | Admitting: Unknown Physician Specialty

## 2014-09-07 ENCOUNTER — Ambulatory Visit (INDEPENDENT_AMBULATORY_CARE_PROVIDER_SITE_OTHER): Payer: Medicare Other | Admitting: Unknown Physician Specialty

## 2014-09-07 VITALS — BP 115/67 | HR 72 | Temp 98.1°F | Ht 61.2 in | Wt 97.2 lb

## 2014-09-07 DIAGNOSIS — I2510 Atherosclerotic heart disease of native coronary artery without angina pectoris: Secondary | ICD-10-CM

## 2014-09-07 DIAGNOSIS — N181 Chronic kidney disease, stage 1: Secondary | ICD-10-CM

## 2014-09-07 DIAGNOSIS — I25118 Atherosclerotic heart disease of native coronary artery with other forms of angina pectoris: Secondary | ICD-10-CM | POA: Insufficient documentation

## 2014-09-07 DIAGNOSIS — E7800 Pure hypercholesterolemia, unspecified: Secondary | ICD-10-CM

## 2014-09-07 DIAGNOSIS — F419 Anxiety disorder, unspecified: Secondary | ICD-10-CM

## 2014-09-07 DIAGNOSIS — I129 Hypertensive chronic kidney disease with stage 1 through stage 4 chronic kidney disease, or unspecified chronic kidney disease: Secondary | ICD-10-CM | POA: Diagnosis not present

## 2014-09-07 DIAGNOSIS — I10 Essential (primary) hypertension: Secondary | ICD-10-CM | POA: Diagnosis not present

## 2014-09-07 LAB — MICROALBUMIN, URINE WAIVED
CREATININE, URINE WAIVED: 200 mg/dL (ref 10–300)
MICROALB, UR WAIVED: 80 mg/L — AB (ref 0–19)

## 2014-09-07 LAB — LIPID PANEL PICCOLO, WAIVED
Chol/HDL Ratio Piccolo,Waive: 2.2 mg/dL
Cholesterol Piccolo, Waived: 135 mg/dL (ref <200)
HDL Chol Piccolo, Waived: 61 mg/dL (ref >59)
LDL CHOL CALC PICCOLO WAIVED: 52 mg/dL (ref <100)
Triglycerides Piccolo,Waived: 107 mg/dL (ref <150)
VLDL CHOL CALC PICCOLO,WAIVE: 21 mg/dL (ref <30)

## 2014-09-07 MED ORDER — ROSUVASTATIN CALCIUM 40 MG PO TABS: 40.0000 mg | ORAL_TABLET | Freq: Every day | ORAL | Status: DC

## 2014-09-07 MED ORDER — LOSARTAN POTASSIUM 25 MG PO TABS: 25.0000 mg | ORAL_TABLET | Freq: Every day | ORAL | Status: DC

## 2014-09-07 NOTE — Assessment & Plan Note (Signed)
Will rx Zoloft 25 mg QD

## 2014-09-07 NOTE — Assessment & Plan Note (Signed)
Stable, continue present medications.   

## 2014-09-07 NOTE — Progress Notes (Signed)
BP 115/67 mmHg  Pulse 72  Temp(Src) 98.1 F (36.7 C)  Ht 5' 1.2" (1.554 m)  Wt 97 lb 3.2 oz (44.09 kg)  BMI 18.26 kg/m2  SpO2 99%  LMP  (LMP Unknown)   Subjective:    Patient ID: Christine Burch, female    DOB: Dec 29, 1940, 74 y.o.   MRN: [id number]  HPI: Christine Burch is a 74 y.o. female  Chief Complaint  Patient presents with  . Hypertension  . Hyperlipidemia   Hypertension This is a chronic problem. The problem is controlled. Associated symptoms include anxiety. Pertinent negatives include no chest pain, headaches, palpitations or shortness of breath. There are no compliance problems.   Hyperlipidemia This is a chronic problem. The problem is controlled. Recent lipid tests were reviewed and are normal. Pertinent negatives include no chest pain or shortness of breath. There are no compliance problems.    Anxiety States that "things stress me out."  States she was given something in the past which helped but stopped taking it.  States she gets angry at her husband.  She sometimes has insomnia and poor appetite.  States this happens at certain times she gets stressed.  She does work in the yard.  She tends to birds.    Relevant past medical, surgical, family and social history reviewed and updated as indicated. Interim medical history since our last visit reviewed. Allergies and medications reviewed and updated.  Review of Systems  Respiratory: Negative for shortness of breath.   Cardiovascular: Negative for chest pain and palpitations.  Neurological: Negative for headaches.    Per HPI unless specifically indicated above     Objective:    BP 115/67 mmHg  Pulse 72  Temp(Src) 98.1 F (36.7 C)  Ht 5' 1.2" (1.554 m)  Wt 97 lb 3.2 oz (44.09 kg)  BMI 18.26 kg/m2  SpO2 99%  LMP  (LMP Unknown)  Wt Readings from Last 3 Encounters:  09/07/14 97 lb 3.2 oz (44.09 kg)  09/02/14 99 lb (44.906 kg)  08/31/14 99 lb 9.6 oz (45.178 kg)    Physical Exam  Constitutional: She is  oriented to person, place, and time. She appears well-developed and well-nourished. No distress.  HENT:  Head: Normocephalic and atraumatic.  Eyes: Conjunctivae and lids are normal. Right eye exhibits no discharge. Left eye exhibits no discharge. No scleral icterus.  Cardiovascular: Normal rate and regular rhythm.   Pulmonary/Chest: Effort normal and breath sounds normal. No respiratory distress.  Abdominal: Soft. Normal appearance and bowel sounds are normal. There is no splenomegaly or hepatomegaly.  Musculoskeletal: Normal range of motion.  Neurological: She is alert and oriented to person, place, and time.  Skin: Skin is intact. No rash noted. No pallor.  Psychiatric: She has a normal mood and affect. Her behavior is normal. Judgment and thought content normal.      Assessment & Plan:   Problem List Items Addressed This Visit      Unprioritized   Hypertension    Stable, continue present medications.       Relevant Medications   losartan (COZAAR) 25 MG tablet   rosuvastatin (CRESTOR) 40 MG tablet   High cholesterol   Relevant Medications   losartan (COZAAR) 25 MG tablet   rosuvastatin (CRESTOR) 40 MG tablet   Other Relevant Orders   Lipid Panel Piccolo, Waived   Chronic anxiety - Primary    Will rx Zoloft 25 mg QD      CAD (coronary artery disease)    Per Dr.  Parachos      Relevant Medications   losartan (COZAAR) 25 MG tablet   rosuvastatin (CRESTOR) 40 MG tablet       Follow up plan: Return for physical.

## 2014-09-07 NOTE — Assessment & Plan Note (Signed)
Per Dr. Josefa Half

## 2014-09-08 ENCOUNTER — Encounter: Payer: Self-pay | Admitting: Unknown Physician Specialty

## 2014-09-08 ENCOUNTER — Telehealth: Payer: Self-pay | Admitting: Unknown Physician Specialty

## 2014-09-08 LAB — COMPREHENSIVE METABOLIC PANEL
A/G RATIO: 1.6 (ref 1.1–2.5)
ALK PHOS: 49 IU/L (ref 39–117)
ALT: 12 IU/L (ref 0–32)
AST: 13 IU/L (ref 0–40)
Albumin: 4.4 g/dL (ref 3.5–4.8)
BUN/Creatinine Ratio: 17 (ref 11–26)
BUN: 13 mg/dL (ref 8–27)
Bilirubin Total: 1.6 mg/dL — ABNORMAL HIGH (ref 0.0–1.2)
CHLORIDE: 103 mmol/L (ref 97–108)
CO2: 25 mmol/L (ref 18–29)
Calcium: 9.6 mg/dL (ref 8.7–10.3)
Creatinine, Ser: 0.77 mg/dL (ref 0.57–1.00)
GFR calc non Af Amer: 77 mL/min/{1.73_m2} (ref >59)
GFR, EST AFRICAN AMERICAN: 89 mL/min/{1.73_m2} (ref >59)
GLUCOSE: 97 mg/dL (ref 65–99)
Globulin, Total: 2.8 g/dL (ref 1.5–4.5)
POTASSIUM: 4.7 mmol/L (ref 3.5–5.2)
Sodium: 143 mmol/L (ref 134–144)
Total Protein: 7.2 g/dL (ref 6.0–8.5)

## 2014-09-08 MED ORDER — SERTRALINE HCL 50 MG PO TABS: 50.0000 mg | ORAL_TABLET | Freq: Every day | ORAL | Status: DC

## 2014-09-08 NOTE — Telephone Encounter (Signed)
Called and let patient know that rx was sent to pharmacy.

## 2014-09-08 NOTE — Telephone Encounter (Signed)
Pt looking for some "stress pills" [facility]

## 2014-09-08 NOTE — Telephone Encounter (Signed)
Routing to provider. I see in the note where you were going to prescribe Zoloft for patient's anxiety but I do not see it in the patient's medication list.

## 2014-09-08 NOTE — Telephone Encounter (Signed)
Please let her know I sent them in

## 2014-09-15 DIAGNOSIS — Z9889 Other specified postprocedural states: Secondary | ICD-10-CM | POA: Diagnosis not present

## 2014-09-15 DIAGNOSIS — I10 Essential (primary) hypertension: Secondary | ICD-10-CM | POA: Diagnosis not present

## 2014-09-15 DIAGNOSIS — E780 Pure hypercholesterolemia: Secondary | ICD-10-CM | POA: Diagnosis not present

## 2014-09-15 DIAGNOSIS — I679 Cerebrovascular disease, unspecified: Secondary | ICD-10-CM | POA: Diagnosis not present

## 2014-09-17 ENCOUNTER — Telehealth: Payer: Self-pay | Admitting: Unknown Physician Specialty

## 2014-09-17 NOTE — Telephone Encounter (Signed)
disregard

## 2014-09-18 ENCOUNTER — Other Ambulatory Visit: Payer: Self-pay

## 2014-09-18 MED ORDER — RANITIDINE HCL 150 MG PO CAPS: 150.0000 mg | ORAL_CAPSULE | Freq: Two times a day (BID) | ORAL | Status: DC

## 2014-09-18 NOTE — Telephone Encounter (Signed)
Patient was last seen on 09/07/14 and pharmacy is [facility] in [place].

## 2014-09-25 ENCOUNTER — Telehealth: Payer: Self-pay | Admitting: Unknown Physician Specialty

## 2014-09-25 MED ORDER — CITALOPRAM HYDROBROMIDE 20 MG PO TABS: 20.0000 mg | ORAL_TABLET | Freq: Every day | ORAL | Status: DC

## 2014-09-25 NOTE — Telephone Encounter (Signed)
Pt would like a call back regarding side effects to the new medicaiton

## 2014-09-25 NOTE — Telephone Encounter (Signed)
Called and spoke to patient. She stated that Sertraline has been causing her to have diarrhea. Wants to know if she can be switched to something else.

## 2014-09-25 NOTE — Telephone Encounter (Signed)
Called and let patient know that new rx was sent to pharmacy.

## 2014-09-25 NOTE — Telephone Encounter (Signed)
Switched to Citalopram

## 2014-09-30 ENCOUNTER — Ambulatory Visit (INDEPENDENT_AMBULATORY_CARE_PROVIDER_SITE_OTHER): Payer: Medicare Other | Admitting: General Surgery

## 2014-09-30 ENCOUNTER — Encounter: Payer: Self-pay | Admitting: General Surgery

## 2014-09-30 VITALS — BP 122/64 | HR 84 | Resp 14 | Ht 62.0 in | Wt 97.2 lb

## 2014-09-30 DIAGNOSIS — K3189 Other diseases of stomach and duodenum: Secondary | ICD-10-CM

## 2014-09-30 DIAGNOSIS — I2510 Atherosclerotic heart disease of native coronary artery without angina pectoris: Secondary | ICD-10-CM

## 2014-09-30 DIAGNOSIS — K319 Disease of stomach and duodenum, unspecified: Secondary | ICD-10-CM

## 2014-09-30 NOTE — Patient Instructions (Addendum)
Call with any questions and concerns.   Patient has been scheduled for a CT abdomen with contrast at [facility] for 10-02-14 at 9:30 am (arrive 9:15 am). Prep: no solids 4 hours prior but patient may have clear liquids up until exam time, pick up prep kit, and take medication list. Patient verbalizes understanding.

## 2014-09-30 NOTE — Progress Notes (Signed)
Patient ID: Christine Burch, female   DOB: 31-Oct-1940, 74 y.o.   MRN: [id number]  Chief Complaint  Patient presents with  . Mass    Stomach- reasses    HPI Christine Burch is a 74 y.o. female.  She is here today for reassessment of stomach mass. She has no complaints today. 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 HPI  Past Medical History  Diagnosis Date  . Hypertension 2013  . Enlargement of lymph nodes 2013  . High cholesterol   . Acid reflux   . Tumors 2006    Past Surgical History  Procedure Laterality Date  . Carotid endarterectomy Right 2011  . Colonoscopy  [id number]    Dr.Hashmi  . Upper gi endoscopy  2012  . Cardiac surgery  2010    stint  . Abdominal hysterectomy  1980    Family History  Problem Relation Age of Onset  . Heart disease Mother   . Heart disease Father   . Arthritis Sister   . Heart disease Sister   . Heart disease Maternal Grandmother   . Heart disease Maternal Grandfather   . Heart disease Paternal Grandmother   . Heart disease Paternal Grandfather     Social History Social History  Substance Use Topics  . Smoking status: Never Smoker   . Smokeless tobacco: Never Used  . Alcohol Use: No    No Known Allergies  Current Outpatient Prescriptions  Medication Sig Dispense Refill  . aspirin 81 MG tablet Take 81 mg by mouth 2 (two) times daily.     . citalopram (CELEXA) 20 MG tablet Take 1 tablet (20 mg total) by mouth daily. 30 tablet 3  . clopidogrel (PLAVIX) 75 MG tablet Take 75 mg by mouth daily.    Marland Kitchen ezetimibe (ZETIA) 10 MG tablet Take 10 mg by mouth daily.    Marland Kitchen losartan (COZAAR) 25 MG tablet Take 1 tablet (25 mg total) by mouth daily. 30 tablet 12  . ranitidine (ZANTAC) 150 MG tablet     . rosuvastatin (CRESTOR) 40 MG tablet Take 1 tablet (40 mg total) by mouth daily. 30 tablet 6   No current facility-administered medications for this visit.    Review of Systems Review of Systems   Constitutional: Negative.   Respiratory: Negative.   Cardiovascular: Negative.     Blood pressure 122/64, pulse 84, resp. rate 14, height _0  (1.575 m), weight 97 lb 3.2 oz (44.09 kg).  Physical Exam Physical Exam  Constitutional: She is oriented to person, place, and time. She appears well-developed and well-nourished.  HENT:  Mouth/Throat: Oropharynx is clear and moist.  Eyes: No scleral icterus.  Cardiovascular: Normal rate and regular rhythm.   Pulmonary/Chest: Effort normal and breath sounds normal.  Abdominal: Soft. Bowel sounds are normal.  Lymphadenopathy:    She has no cervical adenopathy.  Neurological: She is alert and oriented to person, place, and time.  Skin: Skin is warm and dry.  Psychiatric: Her behavior is normal.    Data Reviewed Prior notes, endoscopy reports and 2015 CT scan.  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 The size was less than 2cm.  Needs assessment for change in size.     Plan    CT abdomen with contrast      Plan to schedule a CT scan.  Patient has been scheduled for a CT abdomen with contrast at [facility] for 10-02-14 at  9:30 am (arrive 9:15 am). Prep: no solids 4 hours prior but patient may have clear liquids up until exam time, pick up prep kit, and take medication list. Patient verbalizes understanding.    PCP: Neville Route G 09/30/2014, 3:12 PM

## 2014-10-02 ENCOUNTER — Ambulatory Visit
Admission: RE | Admit: 2014-10-02 | Discharge: 2014-10-02 | Disposition: A | Discharge status: Home / Self Care | Payer: Medicare Other | Source: Ambulatory Visit | Attending: General Surgery | Admitting: General Surgery

## 2014-10-02 DIAGNOSIS — K8020 Calculus of gallbladder without cholecystitis without obstruction: Secondary | ICD-10-CM | POA: Diagnosis not present

## 2014-10-02 DIAGNOSIS — I2510 Atherosclerotic heart disease of native coronary artery without angina pectoris: Secondary | ICD-10-CM | POA: Insufficient documentation

## 2014-10-02 DIAGNOSIS — N281 Cyst of kidney, acquired: Secondary | ICD-10-CM | POA: Insufficient documentation

## 2014-10-02 DIAGNOSIS — K319 Disease of stomach and duodenum, unspecified: Secondary | ICD-10-CM | POA: Diagnosis not present

## 2014-10-02 DIAGNOSIS — K3189 Other diseases of stomach and duodenum: Secondary | ICD-10-CM

## 2014-10-02 DIAGNOSIS — M47896 Other spondylosis, lumbar region: Secondary | ICD-10-CM | POA: Insufficient documentation

## 2014-10-02 MED ORDER — IOHEXOL 300 MG/ML  SOLN
75.0000 mL | Freq: Once | INTRAMUSCULAR | Status: AC | PRN
  Administered 2014-10-02: 75 mL via INTRAVENOUS

## 2014-10-07 NOTE — Progress Notes (Signed)
Appointment scheduled for Monday, 10-12-14.

## 2014-10-12 ENCOUNTER — Encounter: Payer: Self-pay | Admitting: General Surgery

## 2014-10-12 ENCOUNTER — Ambulatory Visit (INDEPENDENT_AMBULATORY_CARE_PROVIDER_SITE_OTHER): Payer: Medicare Other | Admitting: General Surgery

## 2014-10-12 VITALS — BP 90/48 | HR 56 | Resp 14 | Ht 62.0 in | Wt 97.0 lb

## 2014-10-12 DIAGNOSIS — I2510 Atherosclerotic heart disease of native coronary artery without angina pectoris: Secondary | ICD-10-CM

## 2014-10-12 DIAGNOSIS — K3189 Other diseases of stomach and duodenum: Secondary | ICD-10-CM

## 2014-10-12 DIAGNOSIS — K319 Disease of stomach and duodenum, unspecified: Secondary | ICD-10-CM

## 2014-10-12 DIAGNOSIS — K8020 Calculus of gallbladder without cholecystitis without obstruction: Secondary | ICD-10-CM

## 2014-10-12 NOTE — Progress Notes (Signed)
74 year old female here today to follow up from her CT done on 10/02/14. Patient doing well, denies dietary or bowel issues. Denies any pain.  Gastric mass  unchanged on CT scan. Prior biopsy showed this is benign. Pt also has gallstones, asymptomatic. Advised on potential symptoms of biliary source. No need for surgical intervention at present.    Follow up in one year, plan to do an endoscopy in 2017. Patient given information about cholelithiasis.   PCP: Golden Pop

## 2014-10-12 NOTE — Patient Instructions (Signed)
Cholelithiasis °Cholelithiasis (also called gallstones) is a form of gallbladder disease in which gallstones form in your gallbladder. The gallbladder is an organ that stores bile made in the liver, which helps digest fats. Gallstones begin as small crystals and slowly grow into stones. Gallstone pain occurs when the gallbladder spasms and a gallstone is blocking the duct. Pain can also occur when a stone passes out of the duct.  °RISK FACTORS °· Being female.   °· Having multiple pregnancies. Health care providers sometimes advise removing diseased gallbladders before future pregnancies.   °· Being obese. °· Eating a diet heavy in fried foods and fat.   °· Being older than 60 years and increasing age.   °· Prolonged use of medicines containing female hormones.   °· Having diabetes mellitus.   °· Rapidly losing weight.   °· Having a family history of gallstones (heredity).   °SYMPTOMS °· Nausea.   °· Vomiting. °· Abdominal pain.   °· Yellowing of the skin (jaundice).   °· Sudden pain. It may persist from several minutes to several hours. °· Fever.   °· Tenderness to the touch.  °In some cases, when gallstones do not move into the bile duct, people have no pain or symptoms. These are called "silent" gallstones.  °TREATMENT °Silent gallstones do not need treatment. In severe cases, emergency surgery may be required. Options for treatment include: °· Surgery to remove the gallbladder. This is the most common treatment. °· Medicines. These do not always work and may take 6-12 months or more to work. °· Shock wave treatment (extracorporeal biliary lithotripsy). In this treatment an ultrasound machine sends shock waves to the gallbladder to break gallstones into smaller pieces that can pass into the intestines or be dissolved by medicine. °HOME CARE INSTRUCTIONS  °· Only take over-the-counter or prescription medicines for pain, discomfort, or fever as directed by your health care provider.   °· Follow a low-fat diet until  seen again by your health care provider. Fat causes the gallbladder to contract, which can result in pain.   °· Follow up with your health care provider as directed. Attacks are almost always recurrent and surgery is usually required for permanent treatment.   °SEEK IMMEDIATE MEDICAL CARE IF:  °· Your pain increases and is not controlled by medicines.   °· You have a fever or persistent symptoms for more than 2-3 days.   °· You have a fever and your symptoms suddenly get worse.   °· You have persistent nausea and vomiting.   °MAKE SURE YOU:  °· Understand these instructions. °· Will watch your condition. °· Will get help right away if you are not doing well or get worse. °Document Released: 01/05/2005 Document Revised: 09/11/2012 Document Reviewed: 07/03/2012 °ExitCare® Patient Information ©2015 [employer]. This information is not intended to replace advice given to you by your health care provider. Make sure you discuss any questions you have with your health care provider. ° °

## 2014-10-13 DIAGNOSIS — H40023 Open angle with borderline findings, high risk, bilateral: Secondary | ICD-10-CM | POA: Diagnosis not present

## 2014-10-27 ENCOUNTER — Ambulatory Visit (INDEPENDENT_AMBULATORY_CARE_PROVIDER_SITE_OTHER): Payer: Medicare Other | Admitting: Unknown Physician Specialty

## 2014-10-27 ENCOUNTER — Encounter: Payer: Self-pay | Admitting: Unknown Physician Specialty

## 2014-10-27 VITALS — BP 126/65 | HR 84 | Temp 98.1°F | Ht 60.5 in | Wt 95.0 lb

## 2014-10-27 DIAGNOSIS — Z0000 Encounter for general adult medical examination without abnormal findings: Secondary | ICD-10-CM

## 2014-10-27 DIAGNOSIS — I2510 Atherosclerotic heart disease of native coronary artery without angina pectoris: Secondary | ICD-10-CM

## 2014-10-27 DIAGNOSIS — I10 Essential (primary) hypertension: Secondary | ICD-10-CM | POA: Diagnosis not present

## 2014-10-27 DIAGNOSIS — E7800 Pure hypercholesterolemia, unspecified: Secondary | ICD-10-CM | POA: Diagnosis not present

## 2014-10-27 DIAGNOSIS — F419 Anxiety disorder, unspecified: Secondary | ICD-10-CM

## 2014-10-27 NOTE — Patient Instructions (Signed)
.   Health Maintenance  Topic Date Due  . Flu Shot  04/23/2015*  . DEXA scan (bone density measurement)  10/28/2015*  . Shingles Vaccine  10/28/2015*  . Pneumonia vaccines (1 of 2 - PCV13) 10/28/2015*  . Mammogram  05/11/2015  . Tetanus Vaccine  10/26/2015  . Colon Cancer Screening  04/03/2022  *Topic was postponed. The date shown is not the original due date.

## 2014-10-27 NOTE — Assessment & Plan Note (Signed)
Much improved.  Continue present medication 

## 2014-10-27 NOTE — Assessment & Plan Note (Signed)
Stable, continue present medications.   

## 2014-10-27 NOTE — Progress Notes (Signed)
BP 126/65 mmHg  Pulse 84  Temp(Src) 98.1 F (36.7 C)  Ht 5' 0.5" (1.537 m)  Wt 95 lb (43.092 kg)  BMI 18.24 kg/m2  SpO2 98%  LMP  (LMP Unknown)   Subjective:    Patient ID: Christine Burch, female    DOB: 15-Jan-1941, 74 y.o.   MRN: [id number]  HPI: Christine Burch is a 74 y.o. female  Chief Complaint  Patient presents with  . Medicare Wellness   Depression/anxiety "that medicine you gave me for stress I think is really helping"  Depression screen PHQ 2/9 10/27/2014  Decreased Interest 0  Down, Depressed, Hopeless 0  PHQ - 2 Score 0    Hypertension This is a new problem. The problem is unchanged. The problem is controlled. Pertinent negatives include no anxiety, blurred vision, chest pain, headaches, malaise/fatigue, neck pain, orthopnea, palpitations, peripheral edema, PND, shortness of breath or sweats. The current treatment provides significant improvement. There are no compliance problems.  There is no history of angina or heart failure. Identifiable causes of hypertension include chronic renal disease.  Hyperlipidemia This is a chronic problem. Exacerbating diseases include chronic renal disease. There are no known factors aggravating her hyperlipidemia. Pertinent negatives include no chest pain or shortness of breath. The current treatment provides significant improvement of lipids. Compliance problems include adherence to exercise and adherence to diet.       Functional Status Survey: Is the patient deaf or have difficulty hearing?: No Does the patient have difficulty seeing, even when wearing glasses/contacts?: No Does the patient have difficulty concentrating, remembering, or making decisions?: No Does the patient have difficulty walking or climbing stairs?: No Does the patient have difficulty dressing or bathing?: No Does the patient have difficulty doing errands alone such as visiting a doctor's office or shopping?: No   Relevant past medical, surgical, family and  social history reviewed and updated as indicated. Interim medical history since our last visit reviewed. Allergies and medications reviewed and updated.  Review of Systems  Constitutional: Negative for malaise/fatigue.  Eyes: Negative for blurred vision.  Respiratory: Negative for shortness of breath.   Cardiovascular: Negative for chest pain, palpitations, orthopnea and PND.  Musculoskeletal: Negative for neck pain.  Neurological: Negative for headaches.    Per HPI unless specifically indicated above     Objective:    BP 126/65 mmHg  Pulse 84  Temp(Src) 98.1 F (36.7 C)  Ht 5' 0.5" (1.537 m)  Wt 95 lb (43.092 kg)  BMI 18.24 kg/m2  SpO2 98%  LMP  (LMP Unknown)  Wt Readings from Last 3 Encounters:  10/27/14 95 lb (43.092 kg)  10/12/14 97 lb (43.999 kg)  09/30/14 97 lb 3.2 oz (44.09 kg)    Physical Exam  Constitutional: She is oriented to person, place, and time. She appears well-developed and well-nourished.  HENT:  Head: Normocephalic and atraumatic.  Eyes: Pupils are equal, round, and reactive to light. Right eye exhibits no discharge. Left eye exhibits no discharge. No scleral icterus.  Neck: Normal range of motion. Neck supple. Carotid bruit is not present. No thyromegaly present.  Cardiovascular: Normal rate, regular rhythm and normal heart sounds.  Exam reveals no gallop and no friction rub.   No murmur heard. Pulmonary/Chest: Effort normal and breath sounds normal. No respiratory distress. She has no wheezes. She has no rales.  Abdominal: Soft. Bowel sounds are normal. There is no tenderness. There is no rebound.  Genitourinary: No breast swelling, tenderness or discharge.  Musculoskeletal: Normal range of motion.  Lymphadenopathy:    She has no cervical adenopathy.  Neurological: She is alert and oriented to person, place, and time.  Skin: Skin is warm, dry and intact. No rash noted.  Psychiatric: She has a normal mood and affect. Her speech is normal and  behavior is normal. Judgment and thought content normal. Cognition and memory are normal.    Results for orders placed or performed in visit on 09/07/14  Lipid Panel Piccolo, [employer]  Result Value Ref Range   Cholesterol Piccolo, Waived 135 <200 mg/dL   HDL Chol Piccolo, Waived 61 >59 mg/dL   Triglycerides Piccolo,Waived 107 <150 mg/dL   Chol/HDL Ratio Piccolo,Waive 2.2 mg/dL   LDL Chol Calc Piccolo Waived 52 <100 mg/dL   VLDL Chol Calc Piccolo,Waive 21 <30 mg/dL  Microalbumin, Urine Waived  Result Value Ref Range   Microalb, Ur Waived 80 (H) 0 - 19 mg/L   Creatinine, Urine Waived 200 10 - 300 mg/dL   Microalb/Creat Ratio 30-300 (H) <30 mg/g  Comprehensive metabolic panel  Result Value Ref Range   Glucose 97 65 - 99 mg/dL   BUN 13 8 - 27 mg/dL   Creatinine, Ser 0.77 0.57 - 1.00 mg/dL   GFR calc non Af Amer 77 >59 mL/min/1.73   GFR calc Af Amer 89 >59 mL/min/1.73   BUN/Creatinine Ratio 17 11 - 26   Sodium 143 134 - 144 mmol/L   Potassium 4.7 3.5 - 5.2 mmol/L   Chloride 103 97 - 108 mmol/L   CO2 25 18 - 29 mmol/L   Calcium 9.6 8.7 - 10.3 mg/dL   Total Protein 7.2 6.0 - 8.5 g/dL   Albumin 4.4 3.5 - 4.8 g/dL   Globulin, Total 2.8 1.5 - 4.5 g/dL   Albumin/Globulin Ratio 1.6 1.1 - 2.5   Bilirubin Total 1.6 (H) 0.0 - 1.2 mg/dL   Alkaline Phosphatase 49 39 - 117 IU/L   AST 13 0 - 40 IU/L   ALT 12 0 - 32 IU/L      Assessment & Plan:   Problem List Items Addressed This Visit      Unprioritized   Hypertension - Primary    Stable, continue present medications.       High cholesterol    Stable, continue present medications.       Chronic anxiety    Much improved.  Continue present medication       Other Visit Diagnoses    Annual physical exam            Follow up plan: Return in about 6 months (around 04/27/2015).

## 2014-11-11 ENCOUNTER — Other Ambulatory Visit: Payer: Self-pay

## 2014-11-11 MED ORDER — EZETIMIBE 10 MG PO TABS: 10.0000 mg | ORAL_TABLET | Freq: Every day | ORAL | Status: DC

## 2014-11-11 NOTE — Telephone Encounter (Signed)
Patient was last seen 10/27/14 and pharmacy is [facility] in [place].

## 2015-01-06 ENCOUNTER — Encounter: Payer: Self-pay | Admitting: *Deleted

## 2015-03-02 ENCOUNTER — Ambulatory Visit: Payer: Medicare Other

## 2015-03-02 ENCOUNTER — Encounter: Payer: Self-pay | Admitting: General Surgery

## 2015-03-02 ENCOUNTER — Ambulatory Visit (INDEPENDENT_AMBULATORY_CARE_PROVIDER_SITE_OTHER): Payer: Medicare Other | Admitting: General Surgery

## 2015-03-02 VITALS — BP 110/58 | HR 82 | Resp 14 | Ht 62.0 in | Wt 100.0 lb

## 2015-03-02 DIAGNOSIS — K319 Disease of stomach and duodenum, unspecified: Secondary | ICD-10-CM

## 2015-03-02 DIAGNOSIS — K3189 Other diseases of stomach and duodenum: Secondary | ICD-10-CM | POA: Insufficient documentation

## 2015-03-02 DIAGNOSIS — Z860100 Personal history of colon polyps, unspecified: Secondary | ICD-10-CM

## 2015-03-02 DIAGNOSIS — I6529 Occlusion and stenosis of unspecified carotid artery: Secondary | ICD-10-CM | POA: Insufficient documentation

## 2015-03-02 DIAGNOSIS — I6522 Occlusion and stenosis of left carotid artery: Secondary | ICD-10-CM

## 2015-03-02 MED ORDER — POLYETHYLENE GLYCOL 3350 17 GM/SCOOP PO POWD: 1.0000 | Freq: Once | ORAL | Status: DC

## 2015-03-02 NOTE — Patient Instructions (Addendum)
The patient is aware to call back for any questions or concerns. Follow up in 1 year.  Colonoscopy A colonoscopy is an exam to look at the entire large intestine (colon). This exam can help find problems such as tumors, polyps, inflammation, and areas of bleeding. The exam takes about 1 hour.  LET [facility] CARE PROVIDER KNOW ABOUT:   Any allergies you have.  All medicines you are taking, including vitamins, herbs, eye drops, creams, and over-the-counter medicines.  Previous problems you or members of your family have had with the use of anesthetics.  Any blood disorders you have.  Previous surgeries you have had.  Medical conditions you have. RISKS AND COMPLICATIONS  Generally, this is a safe procedure. However, as with any procedure, complications can occur. Possible complications include:  Bleeding.  Tearing or rupture of the colon wall.  Reaction to medicines given during the exam.  Infection (rare). BEFORE THE PROCEDURE   Ask your health care provider about changing or stopping your regular medicines.  You may be prescribed an oral bowel prep. This involves drinking a large amount of medicated liquid, starting the day before your procedure. The liquid will cause you to have multiple loose stools until your stool is almost clear or light green. This cleans out your colon in preparation for the procedure.  Do not eat or drink anything else once you have started the bowel prep, unless your health care provider tells you it is safe to do so.  Arrange for someone to drive you home after the procedure. PROCEDURE   You will be given medicine to help you relax (sedative).  You will lie on your side with your knees bent.  A long, flexible tube with a light and camera on the end (colonoscope) will be inserted through the rectum and into the colon. The camera sends video back to a computer screen as it moves through the colon. The colonoscope also releases carbon dioxide gas to  inflate the colon. This helps your health care provider see the area better.  During the exam, your health care provider may take a small tissue sample (biopsy) to be examined under a microscope if any abnormalities are found.  The exam is finished when the entire colon has been viewed. AFTER THE PROCEDURE   Do not drive for 24 hours after the exam.  You may have a small amount of blood in your stool.  You may pass moderate amounts of gas and have mild abdominal cramping or bloating. This is caused by the gas used to inflate your colon during the exam.  Ask when your test results will be ready and how you will get your results. Make sure you get your test results.   This information is not intended to replace advice given to you by your health care provider. Make sure you discuss any questions you have with your health care provider.   Document Released: 01/07/2000 Document Revised: 10/30/2012 Document Reviewed: 09/16/2012 Elsevier Interactive Patient Education [employer].  The patient is scheduled for a Colonoscopy at [facility] on 03/31/15. She will stop her Plavix 5 days prior. She will only take her Losartan the day of at 6 am with a small sip of water. She may continue to take her 81 mg Aspirin. They are aware to call the day before to get their arrival time. Miralax prescription has been sent into the patient's pharmacy. The patient is aware of date and instructions.

## 2015-03-02 NOTE — Progress Notes (Signed)
Patient ID: Christine Burch, female   DOB: Jan 18, 1941, 76 y.o.   MRN: [id number]  Chief Complaint  Patient presents with  . Follow-up    HPI Christine Burch is a 75 y.o. female.  Here today for carotid ultrasound and discuss   colonoscopy. She denies any abdominal pain, diarrhea or constipation. She has a history of colon polyps. 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 I have reviewed the history of present illness with the patient.  HPI  Past Medical History  Diagnosis Date  . Hypertension 2013  . Enlargement of lymph nodes 2013  . High cholesterol   . Acid reflux   . Tumors 2006  . Gallstones   . Colon polyp 2012, 2014    Past Surgical History  Procedure Laterality Date  . Carotid endarterectomy Right 2011  . Colonoscopy  [id number]    Dr.Hashmi  . Upper gi endoscopy  2012, 2014  . Cardiac surgery  2010    stint  . Abdominal hysterectomy  1980    Family History  Problem Relation Age of Onset  . Heart disease Mother   . Heart disease Father   . Arthritis Sister   . Heart disease Sister   . Heart disease Maternal Grandmother   . Heart disease Maternal Grandfather   . Heart disease Paternal Grandmother   . Heart disease Paternal Grandfather     Social History Social History  Substance Use Topics  . Smoking status: Never Smoker   . Smokeless tobacco: Never Used  . Alcohol Use: No    No Known Allergies  Current Outpatient Prescriptions  Medication Sig Dispense Refill  . aspirin 81 MG tablet Take 81 mg by mouth 2 (two) times daily.     . clopidogrel (PLAVIX) 75 MG tablet Take 75 mg by mouth daily.    Marland Kitchen ezetimibe (ZETIA) 10 MG tablet Take 1 tablet (10 mg total) by mouth daily. 90 tablet 3  . losartan (COZAAR) 25 MG tablet Take 1 tablet (25 mg total) by mouth daily. 30 tablet 12  . ranitidine (ZANTAC) 150 MG tablet Take 150 mg by mouth 2 (two) times daily.     . rosuvastatin (CRESTOR) 40 MG tablet Take 1 tablet (40 mg total) by mouth daily. 30 tablet 6  . polyethylene  glycol powder (GLYCOLAX/MIRALAX) powder Take 255 g by mouth once. 255 g 0   No current facility-administered medications for this visit.    Review of Systems Review of Systems  Constitutional: Negative.   Respiratory: Negative.   Cardiovascular: Negative.   Neurological: Negative for light-headedness and headaches.    Blood pressure 110/58, pulse 82, resp. rate 14, height 5\' 2"  (1.575 m), weight 100 lb (45.36 kg).  Physical Exam Physical Exam  Constitutional: She is oriented to person, place, and time. She appears well-developed and well-nourished.  HENT:  Mouth/Throat: Oropharynx is clear and moist.  Eyes: Conjunctivae are normal. No scleral icterus.  Neck: Neck supple. No thyromegaly present.  Cardiovascular: Normal rate, regular rhythm and normal heart sounds.   Pulses:      Carotid pulses are on the left side with bruit. Pulmonary/Chest: Effort normal and breath sounds normal.  Abdominal: Soft. Bowel sounds are normal. There is no hepatomegaly. There is no tenderness. No hernia.  Lymphadenopathy:    She has no cervical adenopathy.  Neurological: She is alert and oriented to person, place, and time.  Skin: Skin is warm and dry.  Psychiatric: Her behavior is normal.    Data Reviewed Prior notes,  colonoscopy and pathology.   Assessment    Left sided carotid doppler was stable mild plaquing. Patient has not had EUS since 2014, though CT in 2016 showed no change in size of her stomach mass. Due for colonoscopy-history of cecal polyps    Plan    Patient to obtain colonoscopy and follow up in 1 year. Colonoscopy with possible biopsy/polypectomy prn: Information regarding the procedure, including its potential risks and complications (including but not limited to perforation of the bowel, which may require emergency surgery to repair, and bleeding) was verbally given to the patient. Educational information regarding lower intestinal endoscopy was given to the patient.  Written instructions for how to complete the bowel prep using Miralax were provided. The importance of drinking ample fluids to avoid dehydration as a result of the prep emphasized.     The patient is scheduled for a Colonoscopy at [facility] on 03/31/15. She will stop her Plavix 5 days prior. She will only take her Losartan the day of at 6 am with a small sip of water. She may continue to take her 81 mg Aspirin. They are aware to call the day before to get their arrival time. Miralax prescription has been sent into the patient's pharmacy. The patient is aware of date and instructions.    PCP:  Golden Pop This information has been scribed by Karie Fetch RNBC.    SANKAR,SEEPLAPUTHUR G 03/02/2015, 1:01 PM

## 2015-03-16 DIAGNOSIS — E7800 Pure hypercholesterolemia, unspecified: Secondary | ICD-10-CM | POA: Diagnosis not present

## 2015-03-16 DIAGNOSIS — I10 Essential (primary) hypertension: Secondary | ICD-10-CM | POA: Diagnosis not present

## 2015-03-16 DIAGNOSIS — Z9889 Other specified postprocedural states: Secondary | ICD-10-CM | POA: Diagnosis not present

## 2015-03-16 DIAGNOSIS — I679 Cerebrovascular disease, unspecified: Secondary | ICD-10-CM | POA: Diagnosis not present

## 2015-03-29 ENCOUNTER — Other Ambulatory Visit: Payer: Self-pay | Admitting: General Surgery

## 2015-03-30 ENCOUNTER — Encounter: Payer: Self-pay | Admitting: *Deleted

## 2015-03-31 ENCOUNTER — Ambulatory Visit
Admission: RE | Admit: 2015-03-31 | Discharge: 2015-03-31 | Disposition: A | Discharge status: Home / Self Care | Payer: Medicare Other | Source: Ambulatory Visit | Attending: General Surgery | Admitting: General Surgery

## 2015-03-31 ENCOUNTER — Ambulatory Visit: Payer: Medicare Other | Admitting: Anesthesiology

## 2015-03-31 ENCOUNTER — Encounter
Admission: RE | Disposition: A | Discharge status: Home / Self Care | Payer: Self-pay | Source: Ambulatory Visit | Attending: General Surgery

## 2015-03-31 DIAGNOSIS — Z7982 Long term (current) use of aspirin: Secondary | ICD-10-CM | POA: Insufficient documentation

## 2015-03-31 DIAGNOSIS — Z86010 Personal history of colonic polyps: Secondary | ICD-10-CM | POA: Insufficient documentation

## 2015-03-31 DIAGNOSIS — K635 Polyp of colon: Secondary | ICD-10-CM | POA: Diagnosis not present

## 2015-03-31 DIAGNOSIS — K219 Gastro-esophageal reflux disease without esophagitis: Secondary | ICD-10-CM | POA: Diagnosis not present

## 2015-03-31 DIAGNOSIS — K5790 Diverticulosis of intestine, part unspecified, without perforation or abscess without bleeding: Secondary | ICD-10-CM | POA: Diagnosis not present

## 2015-03-31 DIAGNOSIS — E7800 Pure hypercholesterolemia, unspecified: Secondary | ICD-10-CM | POA: Diagnosis not present

## 2015-03-31 DIAGNOSIS — Z90710 Acquired absence of both cervix and uterus: Secondary | ICD-10-CM | POA: Diagnosis not present

## 2015-03-31 DIAGNOSIS — D120 Benign neoplasm of cecum: Secondary | ICD-10-CM | POA: Diagnosis not present

## 2015-03-31 DIAGNOSIS — Z8261 Family history of arthritis: Secondary | ICD-10-CM | POA: Diagnosis not present

## 2015-03-31 DIAGNOSIS — I10 Essential (primary) hypertension: Secondary | ICD-10-CM | POA: Insufficient documentation

## 2015-03-31 DIAGNOSIS — D123 Benign neoplasm of transverse colon: Secondary | ICD-10-CM | POA: Insufficient documentation

## 2015-03-31 DIAGNOSIS — K5730 Diverticulosis of large intestine without perforation or abscess without bleeding: Secondary | ICD-10-CM | POA: Insufficient documentation

## 2015-03-31 DIAGNOSIS — F419 Anxiety disorder, unspecified: Secondary | ICD-10-CM | POA: Diagnosis not present

## 2015-03-31 DIAGNOSIS — Z8249 Family history of ischemic heart disease and other diseases of the circulatory system: Secondary | ICD-10-CM | POA: Diagnosis not present

## 2015-03-31 DIAGNOSIS — Z79899 Other long term (current) drug therapy: Secondary | ICD-10-CM | POA: Insufficient documentation

## 2015-03-31 DIAGNOSIS — Z1211 Encounter for screening for malignant neoplasm of colon: Secondary | ICD-10-CM | POA: Diagnosis not present

## 2015-03-31 HISTORY — PX: COLONOSCOPY WITH PROPOFOL: SHX5780

## 2015-03-31 SURGERY — COLONOSCOPY WITH PROPOFOL
Anesthesia: General

## 2015-03-31 MED ORDER — SODIUM CHLORIDE 0.9 % IV SOLN
INTRAVENOUS | Status: DC
  Administered 2015-03-31: 1000 mL via INTRAVENOUS

## 2015-03-31 MED ORDER — PROPOFOL 500 MG/50ML IV EMUL
INTRAVENOUS | Status: DC | PRN
  Administered 2015-03-31: 100 ug/kg/min via INTRAVENOUS

## 2015-03-31 MED ORDER — GLYCOPYRROLATE 0.2 MG/ML IJ SOLN: INTRAMUSCULAR | Status: DC | PRN

## 2015-03-31 NOTE — H&P (View-Only) (Signed)
Patient ID: Christine Burch, female   DOB: 02-Jan-1941, 75 y.o.   MRN: [id number]  Chief Complaint  Patient presents with  . Follow-up    HPI Christine Burch is a 75 y.o. female.  Here today for carotid ultrasound and discuss   colonoscopy. She denies any abdominal pain, diarrhea or constipation. She has a history of colon polyps. 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 I have reviewed the history of present illness with the patient.  HPI  Past Medical History  Diagnosis Date  . Hypertension 2013  . Enlargement of lymph nodes 2013  . High cholesterol   . Acid reflux   . Tumors 2006  . Gallstones   . Colon polyp 2012, 2014    Past Surgical History  Procedure Laterality Date  . Carotid endarterectomy Right 2011  . Colonoscopy  [id number]    Dr.Hashmi  . Upper gi endoscopy  2012, 2014  . Cardiac surgery  2010    stint  . Abdominal hysterectomy  1980    Family History  Problem Relation Age of Onset  . Heart disease Mother   . Heart disease Father   . Arthritis Sister   . Heart disease Sister   . Heart disease Maternal Grandmother   . Heart disease Maternal Grandfather   . Heart disease Paternal Grandmother   . Heart disease Paternal Grandfather     Social History Social History  Substance Use Topics  . Smoking status: Never Smoker   . Smokeless tobacco: Never Used  . Alcohol Use: No    No Known Allergies  Current Outpatient Prescriptions  Medication Sig Dispense Refill  . aspirin 81 MG tablet Take 81 mg by mouth 2 (two) times daily.     . clopidogrel (PLAVIX) 75 MG tablet Take 75 mg by mouth daily.    Marland Kitchen ezetimibe (ZETIA) 10 MG tablet Take 1 tablet (10 mg total) by mouth daily. 90 tablet 3  . losartan (COZAAR) 25 MG tablet Take 1 tablet (25 mg total) by mouth daily. 30 tablet 12  . ranitidine (ZANTAC) 150 MG tablet Take 150 mg by mouth 2 (two) times daily.     . rosuvastatin (CRESTOR) 40 MG tablet Take 1 tablet (40 mg total) by mouth daily. 30 tablet 6  . polyethylene  glycol powder (GLYCOLAX/MIRALAX) powder Take 255 g by mouth once. 255 g 0   No current facility-administered medications for this visit.    Review of Systems Review of Systems  Constitutional: Negative.   Respiratory: Negative.   Cardiovascular: Negative.   Neurological: Negative for light-headedness and headaches.    Blood pressure 110/58, pulse 82, resp. rate 14, height 5\' 2"  (1.575 m), weight 100 lb (45.36 kg).  Physical Exam Physical Exam  Constitutional: She is oriented to person, place, and time. She appears well-developed and well-nourished.  HENT:  Mouth/Throat: Oropharynx is clear and moist.  Eyes: Conjunctivae are normal. No scleral icterus.  Neck: Neck supple. No thyromegaly present.  Cardiovascular: Normal rate, regular rhythm and normal heart sounds.   Pulses:      Carotid pulses are on the left side with bruit. Pulmonary/Chest: Effort normal and breath sounds normal.  Abdominal: Soft. Bowel sounds are normal. There is no hepatomegaly. There is no tenderness. No hernia.  Lymphadenopathy:    She has no cervical adenopathy.  Neurological: She is alert and oriented to person, place, and time.  Skin: Skin is warm and dry.  Psychiatric: Her behavior is normal.    Data Reviewed Prior notes,  colonoscopy and pathology.   Assessment    Left sided carotid doppler was stable mild plaquing. Patient has not had EUS since 2014, though CT in 2016 showed no change in size of her stomach mass. Due for colonoscopy-history of cecal polyps    Plan    Patient to obtain colonoscopy and follow up in 1 year. Colonoscopy with possible biopsy/polypectomy prn: Information regarding the procedure, including its potential risks and complications (including but not limited to perforation of the bowel, which may require emergency surgery to repair, and bleeding) was verbally given to the patient. Educational information regarding lower intestinal endoscopy was given to the patient.  Written instructions for how to complete the bowel prep using Miralax were provided. The importance of drinking ample fluids to avoid dehydration as a result of the prep emphasized.     The patient is scheduled for a Colonoscopy at [facility] on 03/31/15. She will stop her Plavix 5 days prior. She will only take her Losartan the day of at 6 am with a small sip of water. She may continue to take her 81 mg Aspirin. They are aware to call the day before to get their arrival time. Miralax prescription has been sent into the patient's pharmacy. The patient is aware of date and instructions.    PCP:  Golden Pop This information has been scribed by Karie Fetch RNBC.    Damere Brandenburg G 03/02/2015, 1:01 PM

## 2015-03-31 NOTE — Op Note (Signed)
[facility] Gastroenterology Patient Name: Christine Burch Procedure Date: 03/31/2015 8:50 AM MRN: [id number] Account #: [id number] Date of Birth: 10/30/1940 Admit Type: Outpatient Age: 75 Room: [facility] ENDO ROOM 1 Gender: Female Note Status: Finalized Procedure:            Colonoscopy Indications:          High risk colon cancer surveillance: Personal history                        of colonic polyps Providers:            Seeplaputhur G. Jamal Collin, MD Referring MD:         Guadalupe Maple, MD (Referring MD) Medicines:            General Anesthesia Complications:        No immediate complications. Procedure:            Pre-Anesthesia Assessment:                       - General anesthesia under the supervision of an                        anesthesiologist was determined to be medically                        necessary for this procedure based on review of the                        patient's medical history, medications, and prior                        anesthesia history.                       After obtaining informed consent, the colonoscope was                        passed under direct vision. Throughout the procedure,                        the patient's blood pressure, pulse, and oxygen                        saturations were monitored continuously. The                        Colonoscope was introduced through the anus and                        advanced to the the cecum, identified by the ileocecal                        valve. The colonoscopy was performed without                        difficulty. The patient tolerated the procedure well.                        The quality of the bowel preparation was good. Findings:      The perianal and digital rectal examinations were normal.      Multiple small  and large-mouthed diverticula were found in the sigmoid       colon.      A 20 mm polyp was found in the cecum. The polyp was sessile. It occupied       a fold with  large portion under the fold. Not amenable to eddxcision.       Biopsies were taken with a cold forceps for histology.      A 3 mm polyp was found in the distal transverse colon. The polyp was       sessile. Biopsies were taken with a cold forceps for histology.      The exam was otherwise without abnormality. Impression:           - Diverticulosis in the sigmoid colon.                       - One 20 mm polyp in the cecum. Biopsied.                       - One 3 mm polyp in the distal transverse colon.                        Biopsied.                       - The examination was otherwise normal. Recommendation:       - Await pathology results. Procedure Code(s):    --- Professional ---                       [phone], Colonoscopy, flexible; with biopsy, single or                        multiple Diagnosis Code(s):    --- Professional ---                       Z86.010, Personal history of colonic polyps                       D12.0, Benign neoplasm of cecum                       D12.3, Benign neoplasm of transverse colon (hepatic                        flexure or splenic flexure)                       K57.30, Diverticulosis of large intestine without                        perforation or abscess without bleeding CPT copyright 2016 American Medical Association. All rights reserved. The codes documented in this report are preliminary and upon coder review may  be revised to meet current compliance requirements. [name], MD 03/31/2015 9:55:04 AM This report has been signed electronically. Number of Addenda: 0 Note Initiated On: 03/31/2015 8:50 AM Scope Withdrawal Time: 0 hours 7 minutes 21 seconds  Total Procedure Duration: 0 hours 46 minutes 30 seconds       [facility]

## 2015-03-31 NOTE — Anesthesia Postprocedure Evaluation (Signed)
Anesthesia Post Note  Patient: Christine Burch  Procedure(s) Performed: Procedure(s) (LRB): COLONOSCOPY WITH PROPOFOL (N/A)  Patient location during evaluation: Endoscopy Anesthesia Type: General Level of consciousness: awake and alert Pain management: pain level controlled Vital Signs Assessment: post-procedure vital signs reviewed and stable Respiratory status: spontaneous breathing, nonlabored ventilation, respiratory function stable and patient connected to nasal cannula oxygen Cardiovascular status: blood pressure returned to baseline and stable Postop Assessment: no signs of nausea or vomiting Anesthetic complications: no    Last Vitals:  Filed Vitals:   03/31/15 1015 03/31/15 1023  BP: 147/69 136/75  Pulse: 68 63  Temp:    Resp: 16 11    Last Pain: There were no vitals filed for this visit.               Melroy Bougher S

## 2015-03-31 NOTE — Transfer of Care (Signed)
Immediate Anesthesia Transfer of Care Note  Patient: Christine Burch  Procedure(s) Performed: Procedure(s): COLONOSCOPY WITH PROPOFOL (N/A)  Patient Location: PACU and Endoscopy Unit  Anesthesia Type:General  Level of Consciousness: sedated and responds to stimulation  Airway & Oxygen Therapy: Patient Spontanous Breathing and Patient connected to nasal cannula oxygen  Post-op Assessment: Report given to RN and Post -op Vital signs reviewed and stable  Post vital signs: Reviewed and stable  Last Vitals:  Filed Vitals:   03/31/15 0953 03/31/15 0954  BP: 140/59 140/79  Pulse: 71 73  Temp:  35.8 C  Resp: 29 22    Complications: no anesthetic complications

## 2015-03-31 NOTE — Anesthesia Preprocedure Evaluation (Signed)
Anesthesia Evaluation  Patient identified by MRN, date of birth, ID band Patient awake    Reviewed: Allergy & Precautions, NPO status , Patient's Chart, lab work & pertinent test results, reviewed documented beta blocker date and time   Airway Mallampati: II  TM Distance: >3 FB     Dental  (+) Chipped   Pulmonary           Cardiovascular hypertension,      Neuro/Psych Anxiety    GI/Hepatic   Endo/Other    Renal/GU      Musculoskeletal   Abdominal   Peds  Hematology   Anesthesia Other Findings   Reproductive/Obstetrics                             Anesthesia Physical Anesthesia Plan  ASA: III  Anesthesia Plan: General   Post-op Pain Management:    Induction: Intravenous  Airway Management Planned: Nasal Cannula  Additional Equipment:   Intra-op Plan:   Post-operative Plan:   Informed Consent: I have reviewed the patients History and Physical, chart, labs and discussed the procedure including the risks, benefits and alternatives for the proposed anesthesia with the patient or authorized representative who has indicated his/her understanding and acceptance.     Plan Discussed with: CRNA  Anesthesia Plan Comments:         Anesthesia Quick Evaluation

## 2015-03-31 NOTE — Interval H&P Note (Signed)
History and Physical Interval Note:  03/31/2015 8:04 AM  Christine Burch  has presented today for surgery, with the diagnosis of HX COLON POLYP  The various methods of treatment have been discussed with the patient and family. After consideration of risks, benefits and other options for treatment, the patient has consented to  Procedure(s): COLONOSCOPY WITH PROPOFOL (N/A) as a surgical intervention .  The patient's history has been reviewed, patient examined, no change in status, stable for surgery.  I have reviewed the patient's chart and labs.  Questions were answered to the patient's satisfaction.     SANKAR,SEEPLAPUTHUR G

## 2015-04-01 LAB — SURGICAL PATHOLOGY

## 2015-04-01 MED ORDER — PROPOFOL 10 MG/ML IV BOLUS
INTRAVENOUS | Status: DC | PRN
  Administered 2015-03-31: 70 mg via INTRAVENOUS

## 2015-04-01 NOTE — Addendum Note (Signed)
Addendum  created 04/01/15 [id number] by Jonna Clark, CRNA   Modules edited: Anesthesia Medication Administration

## 2015-04-02 ENCOUNTER — Encounter: Payer: Self-pay | Admitting: General Surgery

## 2015-04-02 ENCOUNTER — Ambulatory Visit (INDEPENDENT_AMBULATORY_CARE_PROVIDER_SITE_OTHER): Payer: Medicare Other | Admitting: General Surgery

## 2015-04-02 VITALS — BP 110/60 | HR 70 | Resp 14 | Ht 62.0 in | Wt 100.0 lb

## 2015-04-02 DIAGNOSIS — K8020 Calculus of gallbladder without cholecystitis without obstruction: Secondary | ICD-10-CM

## 2015-04-02 DIAGNOSIS — K3189 Other diseases of stomach and duodenum: Secondary | ICD-10-CM

## 2015-04-02 DIAGNOSIS — I6522 Occlusion and stenosis of left carotid artery: Secondary | ICD-10-CM

## 2015-04-02 DIAGNOSIS — K319 Disease of stomach and duodenum, unspecified: Secondary | ICD-10-CM

## 2015-04-02 DIAGNOSIS — D126 Benign neoplasm of colon, unspecified: Secondary | ICD-10-CM

## 2015-04-02 NOTE — Patient Instructions (Signed)
Laparoscopic Cholecystectomy Laparoscopic cholecystectomy is surgery to remove the gallbladder. The gallbladder is located in the upper right part of the abdomen, behind the liver. It is a storage sac for bile, which is produced in the liver. Bile aids in the digestion and absorption of fats. Cholecystectomy is often done for inflammation of the gallbladder (cholecystitis). This condition is usually caused by a buildup of gallstones (cholelithiasis) in the gallbladder. Gallstones can block the flow of bile, and that can result in inflammation and pain. In severe cases, emergency surgery may be required. If emergency surgery is not required, you will have time to prepare for the procedure. Laparoscopic surgery is an alternative to open surgery. Laparoscopic surgery has a shorter recovery time. Your common bile duct may also need to be examined during the procedure. If stones are found in the common bile duct, they may be removed. LET YOUR HEALTH CARE PROVIDER KNOW ABOUT:  Any allergies you have.  All medicines you are taking, including vitamins, herbs, eye drops, creams, and over-the-counter medicines.  Previous problems you or members of your family have had with the use of anesthetics.  Any blood disorders you have.  Previous surgeries you have had.  Any medical conditions you have. RISKS AND COMPLICATIONS Generally, this is a safe procedure. However, problems may occur, including:  Infection.  Bleeding.  Allergic reactions to medicines.  Damage to other structures or organs.  A stone remaining in the common bile duct.  A bile leak from the cyst duct that is clipped when your gallbladder is removed.  The need to convert to open surgery, which requires a larger incision in the abdomen. This may be necessary if your surgeon thinks that it is not safe to continue with a laparoscopic procedure. BEFORE THE PROCEDURE  Ask your health care provider about:  Changing or stopping your  regular medicines. This is especially important if you are taking diabetes medicines or blood thinners.  Taking medicines such as aspirin and ibuprofen. These medicines can thin your blood. Do not take these medicines before your procedure if your health care provider instructs you not to.  Follow instructions from your health care provider about eating or drinking restrictions.  Let your health care provider know if you develop a cold or an infection before surgery.  Plan to have someone take you home after the procedure.  Ask your health care provider how your surgical site will be marked or identified.  You may be given antibiotic medicine to help prevent infection. PROCEDURE  To reduce your risk of infection:  Your health care team will wash or sanitize their hands.  Your skin will be washed with soap.  An IV tube may be inserted into one of your veins.  You will be given a medicine to make you fall asleep (general anesthetic).  A breathing tube will be placed in your mouth.  The surgeon will make several small cuts (incisions) in your abdomen.  A thin, lighted tube (laparoscope) that has a tiny camera on the end will be inserted through one of the small incisions. The camera on the laparoscope will send a picture to a TV screen (monitor) in the operating room. This will give the surgeon a good view inside your abdomen.  A gas will be pumped into your abdomen. This will expand your abdomen to give the surgeon more room to perform the surgery.  Other tools that are needed for the procedure will be inserted through the other incisions. The gallbladder will   be removed through one of the incisions.  After your gallbladder has been removed, the incisions will be closed with stitches (sutures), staples, or skin glue.  Your incisions may be covered with a bandage (dressing). The procedure may vary among health care providers and hospitals. AFTER THE PROCEDURE  Your blood  pressure, heart rate, breathing rate, and blood oxygen level will be monitored often until the medicines you were given have worn off.  You will be given medicines as needed to control your pain.   This information is not intended to replace advice given to you by your health care provider. Make sure you discuss any questions you have with your health care provider.   Document Released: 01/09/2005 Document Revised: 09/30/2014 Document Reviewed: 08/21/2012 Elsevier Interactive Patient Education 2016 Elsevier Inc.  

## 2015-04-02 NOTE — Progress Notes (Signed)
Patient ID: Christine Burch, female   DOB: 1940/05/21, 75 y.o.   MRN: [id number]  Chief Complaint  Patient presents with  . Follow-up    HPI Christine Burch is a 75 y.o. female here today to discuss right colon resection. Last colonoscopy was performed 03/31/15 showing Tubular Adenoma in cecum. This polyp was not removable as it straddled a fold with a large portion on distal side of thr fold. In 2012 she had 1.3cm polyp removed from the cecum.  I have reviewed the history of present illness with the patient. HPI  Past Medical History  Diagnosis Date  . Hypertension 2013  . Enlargement of lymph nodes 2013  . High cholesterol   . Acid reflux   . Tumors 2006  . Gallstones   . Colon polyp 2012, 2014    Past Surgical History  Procedure Laterality Date  . Carotid endarterectomy Right 2011  . Colonoscopy  [id number]    Dr.Hashmi  . Upper gi endoscopy  2012, 2014  . Cardiac surgery  2010    stint  . Abdominal hysterectomy  1980  . Colonoscopy with propofol N/A 03/31/2015    Procedure: COLONOSCOPY WITH PROPOFOL;  Surgeon: [name], MD;  Location: [facility] ENDOSCOPY;  Service: Endoscopy;  Laterality: N/A;    Family History  Problem Relation Age of Onset  . Heart disease Mother   . Heart disease Father   . Arthritis Sister   . Heart disease Sister   . Heart disease Maternal Grandmother   . Heart disease Maternal Grandfather   . Heart disease Paternal Grandmother   . Heart disease Paternal Grandfather     Social History Social History  Substance Use Topics  . Smoking status: Never Smoker   . Smokeless tobacco: Never Used  . Alcohol Use: No    No Known Allergies  Current Outpatient Prescriptions  Medication Sig Dispense Refill  . aspirin 81 MG tablet Take 81 mg by mouth 2 (two) times daily.     . clopidogrel (PLAVIX) 75 MG tablet Take 75 mg by mouth daily.    Marland Kitchen ezetimibe (ZETIA) 10 MG tablet Take 1 tablet (10 mg total) by mouth daily. 90 tablet 3  . losartan (COZAAR)  25 MG tablet Take 1 tablet (25 mg total) by mouth daily. 30 tablet 12  . polyethylene glycol powder (GLYCOLAX/MIRALAX) powder Take 255 g by mouth once. 255 g 0  . ranitidine (ZANTAC) 150 MG tablet Take 150 mg by mouth 2 (two) times daily.     . rosuvastatin (CRESTOR) 40 MG tablet Take 1 tablet (40 mg total) by mouth daily. 30 tablet 6   No current facility-administered medications for this visit.    Review of Systems Review of Systems  Constitutional: Negative.   Respiratory: Negative.   Cardiovascular: Negative.     Blood pressure 110/60, pulse 70, resp. rate 14, height 5\' 2"  (1.575 m), weight 100 lb (45.36 kg).  Physical Exam Physical Exam  Constitutional: She is oriented to person, place, and time. She appears well-developed and well-nourished.  HENT:  Head: Normocephalic.  Mouth/Throat: Oropharynx is clear and moist.  Eyes: Conjunctivae are normal. Pupils are equal, round, and reactive to light. No scleral icterus.  Neck: Neck supple. No thyromegaly present.  Cardiovascular: Normal rate, regular rhythm and normal heart sounds.   Pulmonary/Chest: Effort normal and breath sounds normal.  Abdominal: Soft. Bowel sounds are normal. There is hepatomegaly. There is no tenderness. No hernia.  Lymphadenopathy:    She has no cervical adenopathy.  She has no axillary adenopathy.       Right: No inguinal adenopathy present.       Left: No inguinal adenopathy present.  Neurological: She is alert and oriented to person, place, and time.  She was able to relate her activities from this am , yesterday and her colonoscopy encounter on 03/31/15  Skin: Skin is warm and dry.    Data Reviewed Colonoscopy, path reviewed Prior notes , imaging cardiologist's note all reviewed.  Assessment     1. Large cecacl polyp-tubular adenoma. Likely this is a recurrence at same location where a 1.3cm polyp was removed in 2012. She needs a formal right colectomy. 2. Gallstones- no specific ruq pain but  she has had some vague upper abd symptoms off and on. 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 F/U CT last yr showed stable size. 4. CAD-prior stents. Last note from cardiology states she is stable.     Plan     Had a prolonged discussion with patient and her husband. Given that she needs right colon resection, cholecystectomy can be accomplished at same time. May also consider removal of the small GIST in stomach- will get opinion from oncology on this. Dr Hulda Humphrey cardiologist was contacted. He has seen her last month and felt she is stable and can undergo the procedure without any further cardiac work up. Surgical procedure was explained to pt as also risks and benefits.   At the end off discussion her husband stated that he noticed some memory issues occasionally in recent past. He had not brought this to the attention of any provider yet. Will have her PCP evaluate this.    Encounter time 45-50 mins, bulk of it on review of records and discussion.    PCP:  Golden Pop  This information has been scribed by Gaspar Cola CMA.   Anapaula Severt G 04/02/2015, 9:25 AM

## 2015-04-05 ENCOUNTER — Other Ambulatory Visit: Payer: Self-pay | Admitting: *Deleted

## 2015-04-05 MED ORDER — POLYETHYLENE GLYCOL 3350 17 GM/SCOOP PO POWD: ORAL | Status: DC

## 2015-04-05 MED ORDER — NEOMYCIN SULFATE 500 MG PO TABS: ORAL_TABLET | ORAL | Status: DC

## 2015-04-05 MED ORDER — METRONIDAZOLE 500 MG PO TABS: ORAL_TABLET | ORAL | Status: DC

## 2015-04-08 MED ORDER — ORPHENADRINE CITRATE 30 MG/ML IJ SOLN
INTRAMUSCULAR | Status: AC
  Filled 2015-04-08: qty 2

## 2015-04-08 MED ORDER — KETOROLAC TROMETHAMINE 60 MG/2ML IM SOLN
INTRAMUSCULAR | Status: AC
  Filled 2015-04-08: qty 2

## 2015-04-12 ENCOUNTER — Ambulatory Visit (INDEPENDENT_AMBULATORY_CARE_PROVIDER_SITE_OTHER): Payer: Medicare Other | Admitting: General Surgery

## 2015-04-12 ENCOUNTER — Encounter: Payer: Self-pay | Admitting: General Surgery

## 2015-04-12 ENCOUNTER — Other Ambulatory Visit: Payer: Self-pay | Admitting: Unknown Physician Specialty

## 2015-04-12 VITALS — BP 130/74 | HR 80 | Resp 14 | Ht 62.0 in | Wt 101.0 lb

## 2015-04-12 DIAGNOSIS — K3189 Other diseases of stomach and duodenum: Secondary | ICD-10-CM

## 2015-04-12 DIAGNOSIS — D126 Benign neoplasm of colon, unspecified: Secondary | ICD-10-CM

## 2015-04-12 DIAGNOSIS — K319 Disease of stomach and duodenum, unspecified: Secondary | ICD-10-CM

## 2015-04-12 NOTE — Progress Notes (Signed)
Patient ID: Christine Burch, female   DOB: 1940-01-25, 75 y.o.   MRN: [id number]  Chief Complaint  Patient presents with  . Pre-op Exam    right colon rescetion     HPI Christine Burch is a 75 y.o. female here today for her pre op right colon resection scheduled for 04/19/15. HPI  Past Medical History  Diagnosis Date  . Hypertension 2013  . Enlargement of lymph nodes 2013  . High cholesterol   . Acid reflux   . Tumors 2006  . Gallstones   . Colon polyp 2012, 2014    Past Surgical History  Procedure Laterality Date  . Carotid endarterectomy Right 2011  . Colonoscopy  [id number]    Dr.Hashmi  . Upper gi endoscopy  2012, 2014  . Cardiac surgery  2010    stint  . Abdominal hysterectomy  1980  . Colonoscopy with propofol N/A 03/31/2015    Procedure: COLONOSCOPY WITH PROPOFOL;  Surgeon: [name], MD;  Location: [facility];  Service: Endoscopy;  Laterality: N/A;    Family History  Problem Relation Age of Onset  . Heart disease Mother   . Heart disease Father   . Arthritis Sister   . Heart disease Sister   . Heart disease Maternal Grandmother   . Heart disease Maternal Grandfather   . Heart disease Paternal Grandmother   . Heart disease Paternal Grandfather     Social History Social History  Substance Use Topics  . Smoking status: Never Smoker   . Smokeless tobacco: Never Used  . Alcohol Use: No    No Known Allergies  Current Outpatient Prescriptions  Medication Sig Dispense Refill  . aspirin 81 MG tablet Take 81 mg by mouth 2 (two) times daily.     . clopidogrel (PLAVIX) 75 MG tablet Take 75 mg by mouth daily.    Marland Kitchen ezetimibe (ZETIA) 10 MG tablet Take 1 tablet (10 mg total) by mouth daily. 90 tablet 3  . losartan (COZAAR) 25 MG tablet Take 1 tablet (25 mg total) by mouth daily. 30 tablet 12  . metroNIDAZOLE (FLAGYL) 500 MG tablet Take one (1) tablet at 6 pm and one tablet (1) at 11 pm the evening prior to surgery. 2 tablet 0  . neomycin (MYCIFRADIN) 500  MG tablet Take two (2) tablets at 6 pm and two tablets (2) at 11 pm the evening prior to surgery. 4 tablet 0  . polyethylene glycol powder (GLYCOLAX/MIRALAX) powder 255 grams one bottle for colonoscopy prep 255 g 0  . ranitidine (ZANTAC) 150 MG tablet Take 150 mg by mouth 2 (two) times daily.     . rosuvastatin (CRESTOR) 40 MG tablet Take 1 tablet (40 mg total) by mouth daily. 30 tablet 6   No current facility-administered medications for this visit.    Review of Systems Review of Systems  Constitutional: Negative.   Respiratory: Negative.   Cardiovascular: Negative.     Blood pressure 130/74, pulse 80, resp. rate 14, height 5\' 2"  (1.575 m), weight 101 lb (45.813 kg).  Physical Exam Physical Exam  Constitutional: She is oriented to person, place, and time. She appears well-developed and well-nourished.  Eyes: Conjunctivae are normal. No scleral icterus.  Neck: Neck supple.  Cardiovascular: Normal rate, regular rhythm and normal heart sounds.   Pulmonary/Chest: Effort normal and breath sounds normal.  Abdominal: Soft. Bowel sounds are normal. There is no tenderness.  Lymphadenopathy:    She has no cervical adenopathy.  Neurological: She is oriented to person, place,  and time.  Skin: Skin is warm and dry.    Data Reviewed Prior notes  Assessment        Plan        PCP:  [facility] A This information has been scribed by Gaspar Cola CMA.     Gaspar Cola 04/12/2015, 11:27 AM

## 2015-04-12 NOTE — Patient Instructions (Signed)
Stop taking plavix 5 days prior to surgery date.

## 2015-04-12 NOTE — Progress Notes (Signed)
Patient ID: Christine Burch, female   DOB: 03-07-1940, 75 y.o.   MRN: [id number]  Chief Complaint  Patient presents with  . Pre-op Exam    right colon rescetion     HPI Christine Burch is a 75 y.o. female.  She is here to complete her preop instructions. No new problems .  HPI  Past Medical History  Diagnosis Date  . Hypertension 2013  . Enlargement of lymph nodes 2013  . High cholesterol   . Acid reflux   . Tumors 2006  . Gallstones   . Colon polyp 2012, 2014    Past Surgical History  Procedure Laterality Date  . Carotid endarterectomy Right 2011  . Colonoscopy  [id number]    Dr.Hashmi  . Upper gi endoscopy  2012, 2014  . Cardiac surgery  2010    stint  . Abdominal hysterectomy  1980  . Colonoscopy with propofol N/A 03/31/2015    Procedure: COLONOSCOPY WITH PROPOFOL;  Surgeon: [name], MD;  Location: [facility];  Service: Endoscopy;  Laterality: N/A;    Family History  Problem Relation Age of Onset  . Heart disease Mother   . Heart disease Father   . Arthritis Sister   . Heart disease Sister   . Heart disease Maternal Grandmother   . Heart disease Maternal Grandfather   . Heart disease Paternal Grandmother   . Heart disease Paternal Grandfather     Social History Social History  Substance Use Topics  . Smoking status: Never Smoker   . Smokeless tobacco: Never Used  . Alcohol Use: No    No Known Allergies  Current Outpatient Prescriptions  Medication Sig Dispense Refill  . aspirin 81 MG tablet Take 81 mg by mouth 2 (two) times daily.     . clopidogrel (PLAVIX) 75 MG tablet Take 75 mg by mouth daily.    Marland Kitchen ezetimibe (ZETIA) 10 MG tablet Take 1 tablet (10 mg total) by mouth daily. 90 tablet 3  . losartan (COZAAR) 25 MG tablet Take 1 tablet (25 mg total) by mouth daily. 30 tablet 12  . metroNIDAZOLE (FLAGYL) 500 MG tablet Take one (1) tablet at 6 pm and one tablet (1) at 11 pm the evening prior to surgery. 2 tablet 0  . neomycin (MYCIFRADIN) 500  MG tablet Take two (2) tablets at 6 pm and two tablets (2) at 11 pm the evening prior to surgery. 4 tablet 0  . polyethylene glycol powder (GLYCOLAX/MIRALAX) powder 255 grams one bottle for colonoscopy prep 255 g 0  . ranitidine (ZANTAC) 150 MG tablet Take 150 mg by mouth 2 (two) times daily.     . rosuvastatin (CRESTOR) 40 MG tablet Take 1 tablet (40 mg total) by mouth daily. 30 tablet 6   No current facility-administered medications for this visit.    Review of Systems Review of Systems  Blood pressure 130/74, pulse 80, resp. rate 14, height 5\' 2"  (1.575 m), weight 101 lb (45.813 kg).  Physical Exam Physical Exam Not done- completed last week Data Reviewed Prior notes  Assessment    Large cecal polyp, 2cm GIST stomach wall, gallstones.      Plan    Pt had received all information regarding the surgery 10 days ago. She had few questions today which were answered.  Surgery planned for 04/19/15 at [facility]. This patient has been asked to decrease to one 81 mg aspirin starting one week prior to procedure. Also, discontinue Plavix 5 days prior to surgery. Bowel prep instructions were  reviewed with the patient today.        SANKAR,SEEPLAPUTHUR G 04/12/2015, 11:49 AM

## 2015-04-13 ENCOUNTER — Encounter
Admission: RE | Admit: 2015-04-13 | Discharge: 2015-04-13 | Disposition: A | Discharge status: Home / Self Care | Payer: Medicare Other | Source: Ambulatory Visit | Attending: General Surgery | Admitting: General Surgery

## 2015-04-13 DIAGNOSIS — Z01812 Encounter for preprocedural laboratory examination: Secondary | ICD-10-CM | POA: Insufficient documentation

## 2015-04-13 LAB — SURGICAL PCR SCREEN
MRSA, PCR: NEGATIVE
Staphylococcus aureus: NEGATIVE

## 2015-04-13 LAB — CBC
HCT: 35.3 % (ref 35.0–47.0)
Hemoglobin: 11.9 g/dL — ABNORMAL LOW (ref 12.0–16.0)
MCH: 28 pg (ref 26.0–34.0)
MCHC: 33.8 g/dL (ref 32.0–36.0)
MCV: 82.9 fL (ref 80.0–100.0)
PLATELETS: 210 10*3/uL (ref 150–440)
RBC: 4.26 MIL/uL (ref 3.80–5.20)
RDW: 14.2 % (ref 11.5–14.5)
WBC: 6 10*3/uL (ref 3.6–11.0)

## 2015-04-13 NOTE — Patient Instructions (Signed)
  Your procedure is scheduled on: Monday 04/19/15 Report to Day Surgery. 2ND FLOOR MEDICAL MALL ENTRANCE To find out your arrival time please call [phone] between 1PM - 3PM on Friday 04/16/15.  Remember: Instructions that are not followed completely may result in serious medical risk, up to and including death, or upon the discretion of your surgeon and anesthesiologist your surgery may need to be rescheduled.    __X__ 1. Do not eat food or drink liquids after midnight. No gum chewing or hard candies.     __X__ 2. No Alcohol for 24 hours before or after surgery.   ____ 3. Bring all medications with you on the day of surgery if instructed.    __X__ 4. Notify your doctor if there is any change in your medical condition     (cold, fever, infections).     Do not wear jewelry, make-up, hairpins, clips or nail polish.  Do not wear lotions, powders, or perfumes.   Do not shave 48 hours prior to surgery. Men may shave face and neck.  Do not bring valuables to the hospital.    [facility] is not responsible for any belongings or valuables.               Contacts, dentures or bridgework may not be worn into surgery.  Leave your suitcase in the car. After surgery it may be brought to your room.  For patients admitted to the hospital, discharge time is determined by your                treatment team.   Patients discharged the day of surgery will not be allowed to drive home.   Please read over the following fact sheets that you were given:   MRSA Information and Surgical Site Infection Prevention   __X__ Take these medicines the morning of surgery with A SIP OF WATER:    1. LOSARTAN  2. RANITIDINE  3. ROSUVASTATIN  4.  5.  6.  ____ Fleet Enema (as directed)   __X__ Use CHG Soap as directed  ____ Use inhalers on the day of surgery  ____ Stop metformin 2 days prior to surgery    ____ Take 1/2 of usual insulin dose the night before surgery and none on the morning of surgery.    __X__ Stop Coumadin/Plavix/aspirin on ASPIRIN AND PLAVIX AS INSTRUCTED BY dR sANKAR  ____ Stop Anti-inflammatories on    ____ Stop supplements until after surgery.    ____ Bring C-Pap to the hospital.

## 2015-04-13 NOTE — Pre-Procedure Instructions (Signed)
ANESTHESIA - STRESS ECHO AND MOST RECENT CARDIOLOGY NOTE  Result Narrative                     CARDIOLOGY DEPARTMENT                         [place], [place]                            [id number]                   [facility]                       Acct #: [id number]         [address] [place], [place] [address]             Date: 03/10/2014 10:16 AM                                                                  Adult Female Age: 75 yrs                    ECHOCARDIOGRAM REPORT                         Outpatient           STUDY:Stress Echo        TAPE:                         [id number]::[id number]            ECHO:Yes   DOPPLER:Yes  FILE:[id number]             MD1:  WICKER, CHERYL           COLOR:Yes  CONTRAST:No      MACHINE:Philips            Height: 4 in       RV BIOPSY:No         3D:No   SOUND QLTY:Moderate           Weight: 101 lb          MEDIUM:None                                                                  BSA: 1.4 m2 ___________________________________________________________________________________________            HISTORY:Coronary Artery Disease             REASON:Assess, LV function         Indication:I25.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  ___________________________________________________________________________________________ STRESS ECHOCARDIOGRAPHY           Protocol:Treadmill (Modified Bruce)  Drugs:None Target Heart Rate:127 bpm        Maximum Predicted Heart Rate: 149 bpm  +-------------------+-------------------------+-------------------------+------------+        Stage            Duration (mm:ss)         Heart Rate (bpm)         BP      +-------------------+-------------------------+-------------------------+------------+       RESTING                                           77              144/80    +-------------------+-------------------------+-------------------------+------------+        EXERCISE               10:00                     148                /       +-------------------+-------------------------+-------------------------+------------+       RECOVERY                6:41                     100              150/82    +-------------------+-------------------------+-------------------------+------------+    Stress Duration:10:00 mm:ss   Max Stress H.R.:148 bpm        Target Heart Rate Achieved: Yes Maximum workload of 11.80 METS was achieved during exercise   ___________________________________________________________________________________________ WALL SEGMENT CHANGES                        Rest          Stress Anterior Septum Basal:Normal        Hyperkinetic                   [id number]        Hyperkinetic                Apical:Normal        Hyperkinetic    Anterior Wall Basal:Normal        Hyperkinetic                   [id number]        Hyperkinetic                Apical:Normal        Hyperkinetic     Lateral Wall Basal:Normal        Hyperkinetic                   [id number]        Hyperkinetic                Apical:Normal        Hyperkinetic   Posterior Wall Basal:Normal        Hyperkinetic                   [id number]        Hyperkinetic    Inferior Wall Basal:Normal        Hyperkinetic  [id number]        Hyperkinetic                Apical:Normal        Hyperkinetic  Inferior Septum Basal:Normal        Hyperkinetic                   [id number]        Hyperkinetic             Resting EF:>55% (Est.)   Stress EF: >55% (Est.)   ___________________________________________________________________________________________ ADDITIONAL FINDINGS    Chest Discomfort:None         Arrhythmia:None Termination Reason:Fatigue    Adverse Effects:None      Complications:None   ___________________________________________________________________________________________ STRESS ECG RESULTS                                                ECG Results:Normal  ___________________________________________________________________________________________  ECHOCARDIOGRAPHIC DESCRIPTIONS  LEFT VENTRICLE         Size:Normal  Contraction:Normal    LV Masses:No Masses          [id number] Dias.FxClass:Normal  RIGHT VENTRICLE         Size:Normal               Free Wall:Normal  Contraction:Normal               RV Masses:No mass  PERICARDIUM        Fluid:No effusion  _______________________________________________________________________________________  DOPPLER ECHO and OTHER SPECIAL PROCEDURES    Aortic:No AR                  No AS     Mitral:MILD MR                No MS           MV Inflow E Vel=nm*    MV Annulus E'Vel=nm*           E/E'Ratio=nm*  Tricuspid:MILD TR                No TS  Pulmonary:No PR                  No PS   ___________________________________________________________________________________________  ECHOCARDIOGRAPHIC MEASUREMENTS 2D DIMENSIONS AORTA             Values      Normal Range      MAIN PA          Values      Normal Range           Annulus:  nm*       [2.1 - 2.5]                PA Main:  nm*       [1.5 - 2.1]         Aorta Sin:  nm*       [2.7 - 3.3]       RIGHT VENTRICLE       ST Junction:  nm*       [2.3 - 2.9]                RV Base:  nm*       [ < 4.2]         Asc.Aorta:  nm*       [2.3 -  3.1]                 RV Mid:  nm*       [ < 3.5]  LEFT VENTRICLE                                        RV Length:  nm*       [ < 8.6]             LVIDd:  nm*       [3.9 - 5.3]       INFERIOR VENA CAVA             LVIDs:  nm*                                 Max. IVC:  nm*       [ <= 2.1]                FS:  nm*       [> 25]                    Min. IVC:  nm*               SWT:  nm*       [0.5 - 0.9]                   ------------------               PWT:  nm*       [0.5 - 0.9]                   nm* - not measured  LEFT ATRIUM           LA Diam:  nm*       [2.7 - 3.8]       LA A4C Area:  nm*        [ < 20]         LA Volume:  nm*       [22 - 52]  ___________________________________________________________________________________________ INTERPRETATION Normal Stress Echocardiogram NORMAL RIGHT VENTRICULAR SYSTOLIC FUNCTION MILD VALVULAR REGURGITATION (See above) NO VALVULAR STENOSIS NOTED   ___________________________________________________________________________________________  Electronically signed by: MD Miquel Dunn on 03/10/2014 05:24 PM             Performed By: Johnathan Hausen, RDCS, RVT       Ordering Physician: Isaias Cowman ___________________________________________________________________________________________   Status Results Details   Encounter Summary   Progress Notes - in this encounter Isaias Cowman, MD - 03/16/2015 10:15 AM EST Formatting of this note may be different from the original. Established Patient Visit   Chief Complaint: Chief Complaint  Patient presents with  . Follow-up  6 month  Date of Service: 03/16/2015 Date of Birth: Sep 16, 1940 PCP: Kathrine Haddock, NP  History of Present Illness: Ms. Christine Burch is a 74 y.o.female patient who returns for  1. BMS mid left circumflex, proximal RCA, 05/28/2008 2. Essential hypertension 3. Hyperlipidemia 4. Right carotid end arterectomy  The patient returns today, reports doing well. She denies chest pain or shortness of breath. She has not experience any palpitations or heart racing. She denies peripheral edema. The patient is active but does not do any structured exercise. Stress echocardiogram 03/10/2014 revealed normal left ventricular function  without evidence for ischemia.  The patient has essential hypertension, blood pressure under excellent control, currently on losartan, which is well tolerated without apparent side effects. The patient follows a low-sodium diet.  The patient's hyperlipidemia, LDL cholesterol is 52 on 09/07/2014, currently on Crestor which is tolerated well  without apparent side effects, followed by her primary care provider. The patient follows a low-cholesterol, low-fat diet.  Past Medical and Surgical History  Past Medical History Past Medical History  Diagnosis Date  . Cerebrovascular disease  . Hyperlipidemia, unspecified  . Hypertension   Past Surgical History She has a past surgical history that includes Cardiac catheterization.   Medications and Allergies  Current Medications  Current Outpatient Prescriptions  Medication Sig Dispense Refill  . aspirin 81 MG EC tablet Take 162 mg by mouth once daily.  . clopidogrel (PLAVIX) 75 mg tablet Take 1 tablet (75 mg total) by mouth once daily. 30 tablet 7  . CRESTOR 40 mg tablet Take 40 mg by mouth once daily. 6  . losartan (COZAAR) 25 MG tablet Take 25 mg by mouth once daily. 3  . ranitidine (ZANTAC) 150 MG capsule Take 150 mg by mouth 2 (two) times daily. 11  . ZETIA 10 mg tablet Take 10 mg by mouth once daily. 12   No current facility-administered medications for this visit.   Allergies: Review of patient's allergies indicates no known allergies.  Social and Family History  Social History reports that she has never smoked. She has never used smokeless tobacco. She reports that she does not drink alcohol or use illicit drugs.  Family History Family History  Problem Relation Age of Onset  . Heart attack Mother   Review of Systems   Review of Systems: The patient denies chest pain, shortness of breath, orthopnea, paroxysmal nocturnal dyspnea, pedal edema, palpitations, heart racing, presyncope, syncope. Review of 12 Systems is negative except as described above.  Physical Examination   Vitals: Visit Vitals  . BP 120/60  . Pulse 76  . Ht 157.5 cm (5\' 2" )  . Wt 45.4 kg (100 lb)  . BMI 18.29 kg/m2   Ht:157.5 cm (5\' 2" ) Wt:45.4 kg (100 lb) [id number] surface area is 1.41 meters squared. Body mass index is 18.29 kg/(m^2).  HEENT: Pupils equally reactive to light and  accomodation  Neck: Supple without thyromegaly, carotid pulses 2+ Lungs: clear to auscultation bilaterally; no wheezes, rales, rhonchi Heart: Regular rate and rhythm. No gallops, murmurs or rub Abdomen: soft nontender, nondistended, with normal bowel sounds Extremities: no cyanosis, clubbing, or edema Peripheral Pulses: 2+ in all extremities, 2+ femoral pulses bilaterally  Assessment   75 y.o. female with  1. Cerebrovascular disease  2. Essential hypertension  3. H/O cardiac catheterization  4. Pure hypercholesterolemia   75 year old female with multiple coronary stents, currently without chest pain. Since stress echocardiogram did not reveal evidence for ischemia. The patient has essential hypertension, blood pressure under excellent control. The patient's hyperlipidemia with excellent control of LDL cholesterol on Crestor.  Plan   1. Continue current medications 2. Counseled patient about low-sodium diet 3. DASH diet printed instructions given to the patient 4. Counseled patient about low-cholesterol diet 5. Continue Crestor for hyperlipidemia management  6. Low-fat and cholesterol diet printed instructions given to the patient 7. Return to clinic for follow-up in 6 months  No orders of the defined types were placed in this encounter.  Return in about 6 months (around 09/13/2015).  Isaias Cowman, MD    Plan of Treatment -  as of this encounter Upcoming Encounters Upcoming Encounters  Date Type Specialty Care Team Description  09/14/2015 Appointment Cardiology Paraschos, Sheppard Coil, MD  [facility]  [place], [place] [address]  [phone]  [phone] (Fax)    Visit Diagnoses - in this encounter Diagnosis  Cerebrovascular disease - Primary  Unspecified cerebrovascular disease   Essential hypertension  H/O cardiac catheterization  Pure hypercholesterolemia  Document Information Primary Care Provider Kathrine Haddock  (Apr. 01, 2015 - Present) [phone] (Work) [phone] (Fax)  [address], [place] [address] Document Coverage Dates Feb. 21, 2017 - Feb. 21, 2017 [place] [phone] (Work) [place], [place] [address] Encounter Providers Isaias Cowman (Attending) [phone] (Work) [phone] (Fax)  [place] [facility] [place], [place] [address]

## 2015-04-19 ENCOUNTER — Encounter
Admission: RE | Disposition: A | Discharge status: Home / Self Care | Payer: Self-pay | Source: Ambulatory Visit | Attending: General Surgery

## 2015-04-19 ENCOUNTER — Inpatient Hospital Stay
Admission: RE | Admit: 2015-04-19 | Discharge: 2015-04-23 | DRG: 330 | Disposition: A | Discharge status: Home / Self Care | Payer: Medicare Other | Source: Ambulatory Visit | Attending: General Surgery | Admitting: General Surgery

## 2015-04-19 ENCOUNTER — Inpatient Hospital Stay: Payer: Medicare Other | Admitting: Anesthesiology

## 2015-04-19 ENCOUNTER — Encounter: Payer: Self-pay | Admitting: *Deleted

## 2015-04-19 DIAGNOSIS — Z7902 Long term (current) use of antithrombotics/antiplatelets: Secondary | ICD-10-CM

## 2015-04-19 DIAGNOSIS — I739 Peripheral vascular disease, unspecified: Secondary | ICD-10-CM | POA: Diagnosis present

## 2015-04-19 DIAGNOSIS — I10 Essential (primary) hypertension: Secondary | ICD-10-CM | POA: Diagnosis present

## 2015-04-19 DIAGNOSIS — D490 Neoplasm of unspecified behavior of digestive system: Secondary | ICD-10-CM | POA: Diagnosis not present

## 2015-04-19 DIAGNOSIS — D214 Benign neoplasm of connective and other soft tissue of abdomen: Secondary | ICD-10-CM | POA: Diagnosis present

## 2015-04-19 DIAGNOSIS — D371 Neoplasm of uncertain behavior of stomach: Secondary | ICD-10-CM | POA: Diagnosis not present

## 2015-04-19 DIAGNOSIS — K8010 Calculus of gallbladder with chronic cholecystitis without obstruction: Secondary | ICD-10-CM | POA: Diagnosis present

## 2015-04-19 DIAGNOSIS — I2510 Atherosclerotic heart disease of native coronary artery without angina pectoris: Secondary | ICD-10-CM | POA: Diagnosis present

## 2015-04-19 DIAGNOSIS — Z7982 Long term (current) use of aspirin: Secondary | ICD-10-CM | POA: Diagnosis not present

## 2015-04-19 DIAGNOSIS — Z8249 Family history of ischemic heart disease and other diseases of the circulatory system: Secondary | ICD-10-CM | POA: Diagnosis not present

## 2015-04-19 DIAGNOSIS — K635 Polyp of colon: Secondary | ICD-10-CM

## 2015-04-19 DIAGNOSIS — Z955 Presence of coronary angioplasty implant and graft: Secondary | ICD-10-CM | POA: Diagnosis not present

## 2015-04-19 DIAGNOSIS — Z79899 Other long term (current) drug therapy: Secondary | ICD-10-CM

## 2015-04-19 DIAGNOSIS — E7800 Pure hypercholesterolemia, unspecified: Secondary | ICD-10-CM | POA: Diagnosis present

## 2015-04-19 DIAGNOSIS — D120 Benign neoplasm of cecum: Secondary | ICD-10-CM | POA: Diagnosis not present

## 2015-04-19 DIAGNOSIS — D126 Benign neoplasm of colon, unspecified: Secondary | ICD-10-CM | POA: Diagnosis present

## 2015-04-19 DIAGNOSIS — D131 Benign neoplasm of stomach: Secondary | ICD-10-CM | POA: Diagnosis not present

## 2015-04-19 HISTORY — PX: LAPAROSCOPIC RIGHT COLECTOMY: SHX5925

## 2015-04-19 HISTORY — PX: CHOLECYSTECTOMY: SHX55

## 2015-04-19 HISTORY — PX: EXCISION MASS ABDOMINAL: SHX6701

## 2015-04-19 HISTORY — DX: Polyp of colon: K63.5

## 2015-04-19 LAB — CBC
HCT: 30.5 % — ABNORMAL LOW (ref 35.0–47.0)
HEMOGLOBIN: 10.4 g/dL — AB (ref 12.0–16.0)
MCH: 27.9 pg (ref 26.0–34.0)
MCHC: 34 g/dL (ref 32.0–36.0)
MCV: 82.3 fL (ref 80.0–100.0)
Platelets: 179 10*3/uL (ref 150–440)
RBC: 3.7 MIL/uL — AB (ref 3.80–5.20)
RDW: 14.4 % (ref 11.5–14.5)
WBC: 11.9 10*3/uL — ABNORMAL HIGH (ref 3.6–11.0)

## 2015-04-19 LAB — CREATININE, SERUM
CREATININE: 0.58 mg/dL (ref 0.44–1.00)
GFR calc Af Amer: >60 mL/min (ref >60)

## 2015-04-19 SURGERY — COLECTOMY, RIGHT, LAPAROSCOPIC
Anesthesia: General | Laterality: Right

## 2015-04-19 MED ORDER — LACTATED RINGERS IV SOLN
INTRAVENOUS | Status: DC
  Administered 2015-04-19 (×2): via INTRAVENOUS

## 2015-04-19 MED ORDER — ROCURONIUM BROMIDE 100 MG/10ML IV SOLN
INTRAVENOUS | Status: DC | PRN
  Administered 2015-04-19 (×2): 10 mg via INTRAVENOUS

## 2015-04-19 MED ORDER — SODIUM CHLORIDE 0.9 % IV SOLN
1.0000 g | INTRAVENOUS | Status: AC
  Administered 2015-04-19: 1 g via INTRAVENOUS
  Filled 2015-04-19: qty 1

## 2015-04-19 MED ORDER — EZETIMIBE 10 MG PO TABS
10.0000 mg | ORAL_TABLET | Freq: Every day | ORAL | Status: DC
  Administered 2015-04-19 – 2015-04-22 (×4): 10 mg via ORAL
  Filled 2015-04-19 (×4): qty 1

## 2015-04-19 MED ORDER — ALVIMOPAN 12 MG PO CAPS
ORAL_CAPSULE | ORAL | Status: AC
  Administered 2015-04-19: 12 mg via ORAL
  Filled 2015-04-19: qty 1

## 2015-04-19 MED ORDER — ACETAMINOPHEN 650 MG RE SUPP: 650.0000 mg | Freq: Four times a day (QID) | RECTAL | Status: DC | PRN

## 2015-04-19 MED ORDER — MORPHINE SULFATE (PF) 2 MG/ML IV SOLN
2.0000 mg | INTRAVENOUS | Status: DC | PRN
  Administered 2015-04-19 – 2015-04-21 (×4): 2 mg via INTRAVENOUS
  Filled 2015-04-19 (×4): qty 1

## 2015-04-19 MED ORDER — PANTOPRAZOLE SODIUM 40 MG IV SOLR
40.0000 mg | Freq: Every day | INTRAVENOUS | Status: DC
  Administered 2015-04-19 – 2015-04-22 (×4): 40 mg via INTRAVENOUS
  Filled 2015-04-19 (×4): qty 40

## 2015-04-19 MED ORDER — ACETAMINOPHEN 325 MG PO TABS
650.0000 mg | ORAL_TABLET | Freq: Four times a day (QID) | ORAL | Status: DC | PRN
  Administered 2015-04-22: 650 mg via ORAL
  Filled 2015-04-19: qty 2

## 2015-04-19 MED ORDER — SUCCINYLCHOLINE CHLORIDE 20 MG/ML IJ SOLN
INTRAMUSCULAR | Status: DC | PRN
  Administered 2015-04-19: 80 mg via INTRAVENOUS

## 2015-04-19 MED ORDER — LIDOCAINE HCL (PF) 1 % IJ SOLN
INTRAMUSCULAR | Status: AC
  Filled 2015-04-19: qty 30

## 2015-04-19 MED ORDER — LOSARTAN POTASSIUM 25 MG PO TABS
25.0000 mg | ORAL_TABLET | Freq: Every day | ORAL | Status: DC
  Administered 2015-04-19 – 2015-04-22 (×4): 25 mg via ORAL
  Filled 2015-04-19 (×4): qty 1

## 2015-04-19 MED ORDER — ONDANSETRON HCL 4 MG/2ML IJ SOLN: 4.0000 mg | Freq: Once | INTRAMUSCULAR | Status: DC | PRN

## 2015-04-19 MED ORDER — EPHEDRINE SULFATE 50 MG/ML IJ SOLN
INTRAMUSCULAR | Status: DC | PRN
  Administered 2015-04-19: 10 mg via INTRAVENOUS

## 2015-04-19 MED ORDER — ALVIMOPAN 12 MG PO CAPS
12.0000 mg | ORAL_CAPSULE | Freq: Once | ORAL | Status: AC
  Administered 2015-04-19: 12 mg via ORAL

## 2015-04-19 MED ORDER — OXYCODONE HCL 5 MG PO TABS: 5.0000 mg | ORAL_TABLET | ORAL | Status: DC | PRN

## 2015-04-19 MED ORDER — SODIUM CHLORIDE 0.9 % IJ SOLN
INTRAMUSCULAR | Status: AC
  Filled 2015-04-19: qty 50

## 2015-04-19 MED ORDER — FENTANYL CITRATE (PF) 100 MCG/2ML IJ SOLN: 25.0000 ug | INTRAMUSCULAR | Status: DC | PRN

## 2015-04-19 MED ORDER — PROPOFOL 10 MG/ML IV BOLUS
INTRAVENOUS | Status: DC | PRN
  Administered 2015-04-19: 70 mg via INTRAVENOUS

## 2015-04-19 MED ORDER — ACETAMINOPHEN 10 MG/ML IV SOLN
INTRAVENOUS | Status: DC | PRN
  Administered 2015-04-19: 1000 mg via INTRAVENOUS

## 2015-04-19 MED ORDER — ONDANSETRON 8 MG PO TBDP: 4.0000 mg | ORAL_TABLET | Freq: Four times a day (QID) | ORAL | Status: DC | PRN

## 2015-04-19 MED ORDER — FENTANYL CITRATE (PF) 100 MCG/2ML IJ SOLN
INTRAMUSCULAR | Status: DC | PRN
  Administered 2015-04-19 (×3): 50 ug via INTRAVENOUS

## 2015-04-19 MED ORDER — ZOLPIDEM TARTRATE 5 MG PO TABS: 5.0000 mg | ORAL_TABLET | Freq: Every evening | ORAL | Status: DC | PRN

## 2015-04-19 MED ORDER — HYDROMORPHONE HCL 1 MG/ML IJ SOLN
INTRAMUSCULAR | Status: DC | PRN
  Administered 2015-04-19 (×2): .6 mg via INTRAVENOUS

## 2015-04-19 MED ORDER — ENOXAPARIN SODIUM 40 MG/0.4ML ~~LOC~~ SOLN
40.0000 mg | SUBCUTANEOUS | Status: DC
  Administered 2015-04-19 – 2015-04-21 (×3): 40 mg via SUBCUTANEOUS
  Filled 2015-04-19 (×3): qty 0.4

## 2015-04-19 MED ORDER — DEXAMETHASONE SODIUM PHOSPHATE 10 MG/ML IJ SOLN
INTRAMUSCULAR | Status: DC | PRN
  Administered 2015-04-19: 10 mg via INTRAVENOUS

## 2015-04-19 MED ORDER — ALVIMOPAN 12 MG PO CAPS: 12.0000 mg | ORAL_CAPSULE | Freq: Once | ORAL | Status: DC

## 2015-04-19 MED ORDER — ONDANSETRON HCL 4 MG/2ML IJ SOLN
INTRAMUSCULAR | Status: DC | PRN
  Administered 2015-04-19: 4 mg via INTRAVENOUS

## 2015-04-19 MED ORDER — DEXTROSE-NACL 5-0.45 % IV SOLN
INTRAVENOUS | Status: DC
  Administered 2015-04-19 (×2): via INTRAVENOUS

## 2015-04-19 MED ORDER — SUGAMMADEX SODIUM 200 MG/2ML IV SOLN
INTRAVENOUS | Status: DC | PRN
  Administered 2015-04-19: 90.8 mg via INTRAVENOUS

## 2015-04-19 MED ORDER — ONDANSETRON HCL 4 MG/2ML IJ SOLN: 4.0000 mg | Freq: Four times a day (QID) | INTRAMUSCULAR | Status: DC | PRN

## 2015-04-19 MED ORDER — ACETAMINOPHEN 10 MG/ML IV SOLN
INTRAVENOUS | Status: AC
  Filled 2015-04-19: qty 100

## 2015-04-19 MED ORDER — LIDOCAINE HCL (CARDIAC) 20 MG/ML IV SOLN
INTRAVENOUS | Status: DC | PRN
  Administered 2015-04-19: 50 mg via INTRAVENOUS

## 2015-04-19 MED ORDER — BUPIVACAINE HCL (PF) 0.5 % IJ SOLN
INTRAMUSCULAR | Status: AC
  Filled 2015-04-19: qty 30

## 2015-04-19 MED ORDER — CHLORHEXIDINE GLUCONATE 4 % EX LIQD: 1.0000 "application " | Freq: Once | CUTANEOUS | Status: DC

## 2015-04-19 SURGICAL SUPPLY — 104 items
ANCHOR TIS RET SYS 235ML (MISCELLANEOUS) ×4 IMPLANT
APPLICATOR SURGIFLO (MISCELLANEOUS) IMPLANT
APPLIER CLIP LOGIC TI 5 (MISCELLANEOUS) ×4 IMPLANT
APPLIER CLIP ROT 10 11.4 M/L (STAPLE)
BLADE SURG 11 STRL SS SAFETY (MISCELLANEOUS) ×4 IMPLANT
BLADE SURG 15 STRL LF DISP TIS (BLADE) ×2 IMPLANT
BLADE SURG 15 STRL SS (BLADE) ×2
BLADE SURG 15 STRL SS SAFETY (BLADE) ×4 IMPLANT
CANISTER SUCT 1200ML W/VALVE (MISCELLANEOUS) ×4 IMPLANT
CANNULA DILATOR 10 W/SLV (CANNULA) ×3 IMPLANT
CANNULA DILATOR 10MM W/SLV (CANNULA) ×1
CANNULA DILATOR 12 W/SLV (CANNULA) ×3 IMPLANT
CANNULA DILATOR 12MM W/SLV (CANNULA) ×1
CATH CHOLANG 76X19 KUMAR (CATHETERS) IMPLANT
CATH TRAY 16F METER LATEX (MISCELLANEOUS) ×4 IMPLANT
CHLORAPREP W/TINT 26ML (MISCELLANEOUS) ×4 IMPLANT
CLEANER CAUTERY TIP 5X5 PAD (MISCELLANEOUS) ×2 IMPLANT
CLIP APPLIE ROT 10 11.4 M/L (STAPLE) IMPLANT
CLOSURE WOUND 1/2 X4 (GAUZE/BANDAGES/DRESSINGS)
COVER CLAMP SIL LG PBX B (MISCELLANEOUS) IMPLANT
CUTTER ECHEON FLEX ENDO 45 340 (ENDOMECHANICALS) ×4 IMPLANT
DEFOGGER SCOPE WARMER CLEARIFY (MISCELLANEOUS) ×4 IMPLANT
DEVICE HAND ACCESS DEXTUS (MISCELLANEOUS) ×2 IMPLANT
DRAPE C-ARM XRAY 36X54 (DRAPES) IMPLANT
DRAPE INCISE IOBAN 66X45 STRL (DRAPES) ×4 IMPLANT
DRAPE LAPAROTOMY T 102X78X121 (DRAPES) IMPLANT
DRAPE LEGGINS SURG 28X43 STRL (DRAPES) IMPLANT
DRAPE UNDER BUTTOCK W/FLU (DRAPES) IMPLANT
DRESSING TELFA 4X3 1S ST N-ADH (GAUZE/BANDAGES/DRESSINGS) IMPLANT
DRSG OPSITE POSTOP 4X10 (GAUZE/BANDAGES/DRESSINGS) IMPLANT
DRSG OPSITE POSTOP 4X6 (GAUZE/BANDAGES/DRESSINGS) ×4 IMPLANT
DRSG OPSITE POSTOP 4X8 (GAUZE/BANDAGES/DRESSINGS) IMPLANT
DRSG TEGADERM 2-3/8X2-3/4 SM (GAUZE/BANDAGES/DRESSINGS) ×4 IMPLANT
DRSG TEGADERM 4X4.75 (GAUZE/BANDAGES/DRESSINGS) IMPLANT
DRSG TELFA 3X8 NADH (GAUZE/BANDAGES/DRESSINGS) ×16 IMPLANT
ELECT BLADE 6.5 EXT (BLADE) IMPLANT
ELECT REM PT RETURN 9FT ADLT (ELECTROSURGICAL) ×4
ELECTRODE REM PT RTRN 9FT ADLT (ELECTROSURGICAL) ×2 IMPLANT
FILTER LAP SMOKE EVAC STRL (MISCELLANEOUS) IMPLANT
GLOVE BIO SURGEON STRL SZ7 (GLOVE) ×28 IMPLANT
GLOVE BIO SURGEON STRL SZ7.5 (GLOVE) IMPLANT
GLOVE INDICATOR 8.0 STRL GRN (GLOVE) IMPLANT
GOWN STRL REUS W/ TWL LRG LVL3 (GOWN DISPOSABLE) ×16 IMPLANT
GOWN STRL REUS W/TWL LRG LVL3 (GOWN DISPOSABLE) ×16
GRASPER SUT TROCAR 14GX15 (MISCELLANEOUS) ×8 IMPLANT
HANDLE YANKAUER SUCT BULB TIP (MISCELLANEOUS) IMPLANT
HEMOSTAT SURGICEL 2X3 (HEMOSTASIS) IMPLANT
IRRIGATION STRYKERFLOW (MISCELLANEOUS) ×2 IMPLANT
IRRIGATOR STRYKERFLOW (MISCELLANEOUS) ×4
IV LACTATED RINGERS 1000ML (IV SOLUTION) ×4 IMPLANT
KIT RM TURNOVER STRD PROC AR (KITS) ×4 IMPLANT
LABEL OR SOLS (LABEL) ×4 IMPLANT
LIQUID BAND (GAUZE/BANDAGES/DRESSINGS) IMPLANT
NDL INSUFF ACCESS 14 VERSASTEP (NEEDLE) ×4 IMPLANT
NDL SAFETY 22GX1.5 (NEEDLE) ×4 IMPLANT
NS IRRIG 500ML POUR BTL (IV SOLUTION) ×4 IMPLANT
PACK BASIN MINOR ARMC (MISCELLANEOUS) IMPLANT
PACK COLON CLEAN CLOSURE (MISCELLANEOUS) ×4 IMPLANT
PACK LAP CHOLECYSTECTOMY (MISCELLANEOUS) ×4 IMPLANT
PAD CLEANER CAUTERY TIP 5X5 (MISCELLANEOUS) ×2
PAD PREP 24X41 OB/GYN DISP (PERSONAL CARE ITEMS) IMPLANT
PAD TRENDELENBURG OR TABLE (MISCELLANEOUS) ×4 IMPLANT
PENCIL ELECTRO HAND CTR (MISCELLANEOUS) ×4 IMPLANT
PROT DEXTUS HAND ACCESS (MISCELLANEOUS) ×4
RELOAD GOLD ECHELON 45 (STAPLE) ×16 IMPLANT
RELOAD PROXIMATE 75MM BLUE (ENDOMECHANICALS) ×4 IMPLANT
RETRACTOR FIXED LENGTH SML (MISCELLANEOUS) ×4 IMPLANT
SCISSORS METZENBAUM CVD 33 (INSTRUMENTS) ×4 IMPLANT
SET YANKAUER POOLE SUCT (MISCELLANEOUS) ×8 IMPLANT
SHEARS HARMONIC ACE PLUS 36CM (ENDOMECHANICALS) ×4 IMPLANT
SLEEVE ENDOPATH XCEL 5M (ENDOMECHANICALS) IMPLANT
SPOGE SURGIFLO 8M (HEMOSTASIS)
SPONGE LAP 18X18 5 PK (GAUZE/BANDAGES/DRESSINGS) ×4 IMPLANT
SPONGE SURGIFLO 8M (HEMOSTASIS) IMPLANT
STAPLER PROXIMATE 75MM BLUE (STAPLE) ×4 IMPLANT
STAPLER SKIN PROX 35W (STAPLE) ×4 IMPLANT
STRIP CLOSURE SKIN 1/2X4 (GAUZE/BANDAGES/DRESSINGS) IMPLANT
SURGILUBE 2OZ TUBE FLIPTOP (MISCELLANEOUS) ×4 IMPLANT
SUT MNCRL AB 4-0 PS2 18 (SUTURE) IMPLANT
SUT PROLENE 0 CT 1 30 (SUTURE) ×12 IMPLANT
SUT SILK 2 0 (SUTURE) ×2
SUT SILK 2-0 18XBRD TIE 12 (SUTURE) ×2 IMPLANT
SUT SILK 3-0 (SUTURE) ×8 IMPLANT
SUT VIC AB 0 CT1 36 (SUTURE) IMPLANT
SUT VIC AB 0 SH 27 (SUTURE) ×8 IMPLANT
SUT VIC AB 2-0 BRD 54 (SUTURE) IMPLANT
SUT VIC AB 2-0 CT1 27 (SUTURE)
SUT VIC AB 2-0 CT1 TAPERPNT 27 (SUTURE) IMPLANT
SUT VIC AB 2-0 SH 27 (SUTURE) ×2
SUT VIC AB 2-0 SH 27XBRD (SUTURE) ×2 IMPLANT
SUT VIC AB 3-0 54X BRD REEL (SUTURE) ×2 IMPLANT
SUT VIC AB 3-0 BRD 54 (SUTURE) ×2
SUT VIC AB 3-0 SH 27 (SUTURE) ×6
SUT VIC AB 3-0 SH 27X BRD (SUTURE) ×6 IMPLANT
SUT VIC AB 4-0 FS2 27 (SUTURE) ×4 IMPLANT
SWABSTK COMLB BENZOIN TINCTURE (MISCELLANEOUS) IMPLANT
SYR BULB IRRIG 60ML STRL (SYRINGE) ×4 IMPLANT
SYRINGE 10CC LL (SYRINGE) IMPLANT
TROCAR XCEL 12X100 BLDLESS (ENDOMECHANICALS) ×4 IMPLANT
TROCAR XCEL NON-BLD 11X100MML (ENDOMECHANICALS) ×4 IMPLANT
TROCAR XCEL NON-BLD 5MMX100MML (ENDOMECHANICALS) ×4 IMPLANT
TROCAR XCEL UNIV SLVE 11M 100M (ENDOMECHANICALS) IMPLANT
TUBING INSUFFLATOR HEATED (MISCELLANEOUS) ×4 IMPLANT
TUBING INSUFFLATOR HI FLOW (MISCELLANEOUS) ×4 IMPLANT

## 2015-04-19 NOTE — H&P (View-Only) (Signed)
Patient ID: Christine Burch, female   DOB: October 15, 1940, 75 y.o.   MRN: [id number]  Chief Complaint  Patient presents with  . Follow-up    HPI Christine Burch is a 75 y.o. female here today to discuss right colon resection. Last colonoscopy was performed 03/31/15 showing Tubular Adenoma in cecum. This polyp was not removable as it straddled a fold with a large portion on distal side of thr fold. In 2012 she had 1.3cm polyp removed from the cecum.  I have reviewed the history of present illness with the patient. HPI  Past Medical History  Diagnosis Date  . Hypertension 2013  . Enlargement of lymph nodes 2013  . High cholesterol   . Acid reflux   . Tumors 2006  . Gallstones   . Colon polyp 2012, 2014    Past Surgical History  Procedure Laterality Date  . Carotid endarterectomy Right 2011  . Colonoscopy  [id number]    Dr.Hashmi  . Upper gi endoscopy  2012, 2014  . Cardiac surgery  2010    stint  . Abdominal hysterectomy  1980  . Colonoscopy with propofol N/A 03/31/2015    Procedure: COLONOSCOPY WITH PROPOFOL;  Surgeon: [name], MD;  Location: [facility] ENDOSCOPY;  Service: Endoscopy;  Laterality: N/A;    Family History  Problem Relation Age of Onset  . Heart disease Mother   . Heart disease Father   . Arthritis Sister   . Heart disease Sister   . Heart disease Maternal Grandmother   . Heart disease Maternal Grandfather   . Heart disease Paternal Grandmother   . Heart disease Paternal Grandfather     Social History Social History  Substance Use Topics  . Smoking status: Never Smoker   . Smokeless tobacco: Never Used  . Alcohol Use: No    No Known Allergies  Current Outpatient Prescriptions  Medication Sig Dispense Refill  . aspirin 81 MG tablet Take 81 mg by mouth 2 (two) times daily.     . clopidogrel (PLAVIX) 75 MG tablet Take 75 mg by mouth daily.    Marland Kitchen ezetimibe (ZETIA) 10 MG tablet Take 1 tablet (10 mg total) by mouth daily. 90 tablet 3  . losartan (COZAAR)  25 MG tablet Take 1 tablet (25 mg total) by mouth daily. 30 tablet 12  . polyethylene glycol powder (GLYCOLAX/MIRALAX) powder Take 255 g by mouth once. 255 g 0  . ranitidine (ZANTAC) 150 MG tablet Take 150 mg by mouth 2 (two) times daily.     . rosuvastatin (CRESTOR) 40 MG tablet Take 1 tablet (40 mg total) by mouth daily. 30 tablet 6   No current facility-administered medications for this visit.    Review of Systems Review of Systems  Constitutional: Negative.   Respiratory: Negative.   Cardiovascular: Negative.     Blood pressure 110/60, pulse 70, resp. rate 14, height 5\' 2"  (1.575 m), weight 100 lb (45.36 kg).  Physical Exam Physical Exam  Constitutional: She is oriented to person, place, and time. She appears well-developed and well-nourished.  HENT:  Head: Normocephalic.  Mouth/Throat: Oropharynx is clear and moist.  Eyes: Conjunctivae are normal. Pupils are equal, round, and reactive to light. No scleral icterus.  Neck: Neck supple. No thyromegaly present.  Cardiovascular: Normal rate, regular rhythm and normal heart sounds.   Pulmonary/Chest: Effort normal and breath sounds normal.  Abdominal: Soft. Bowel sounds are normal. There is hepatomegaly. There is no tenderness. No hernia.  Lymphadenopathy:    She has no cervical adenopathy.  She has no axillary adenopathy.       Right: No inguinal adenopathy present.       Left: No inguinal adenopathy present.  Neurological: She is alert and oriented to person, place, and time.  She was able to relate her activities from this am , yesterday and her colonoscopy encounter on 03/31/15  Skin: Skin is warm and dry.    Data Reviewed Colonoscopy, path reviewed Prior notes , imaging cardiologist's note all reviewed.  Assessment     1. Large cecacl polyp-tubular adenoma. Likely this is a recurrence at same location where a 1.3cm polyp was removed in 2012. She needs a formal right colectomy. 2. Gallstones- no specific ruq pain but  she has had some vague upper abd symptoms off and on. 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 F/U CT last yr showed stable size. 4. CAD-prior stents. Last note from cardiology states she is stable.     Plan     Had a prolonged discussion with patient and her husband. Given that she needs right colon resection, cholecystectomy can be accomplished at same time. May also consider removal of the small GIST in stomach- will get opinion from oncology on this. Dr Hulda Humphrey cardiologist was contacted. He has seen her last month and felt she is stable and can undergo the procedure without any further cardiac work up. Surgical procedure was explained to pt as also risks and benefits.   At the end off discussion her husband stated that he noticed some memory issues occasionally in recent past. He had not brought this to the attention of any provider yet. Will have her PCP evaluate this.    Encounter time 45-50 mins, bulk of it on review of records and discussion.    PCP:  Golden Pop  This information has been scribed by Gaspar Cola CMA.   Christine Burch G 04/02/2015, 9:25 AM

## 2015-04-19 NOTE — Op Note (Addendum)
Preop diagnosis: 1. Adenomatous polyp of the cecum. 2. Cholelithiasis. 3. Small gastric wall mass.  Post op diagnosis: Same  Operation: Laparoscopy right colectomy, cholecystectomy and excision of gastric wall mass  Surgeon: S.G.Sharlotte Baka  Assistant: Marta Lamas   Anesthesia: Gen.  Complications: None  EBL: About 50 mL  Drains: None  Description: Patient was put to sleep in supine position the operating table. Foley catheter was inserted and the abdomen prepped and draped sterile field. Timeout was performed. Initial port incision was made near the umbilicus with a small vertical incision Veress needle was positioned in the peritoneal cavity verified of the hanging drop method and pneumoperitoneum obtained. A 10 mm port was placed at the site. Camera was introduced with good visualization the peritoneal cavity. An epigastric 5 mm port in the right mid abdominal 5 mm port and a left upper quadrant 67mm ports were placed also placed a 12 mm port in the left lower abdominal area. It was apparent that the gastric wall mass was located somewhat higher location but was just barely visible with the scope lying on the lesser curvature side. Was felt that this would be best dealt with by a hand port at the end of the right colectomy and cholecystectomy. Accordingly attention was directed to the right side of the abdomen initially cholecystectomy was completed the gallbladder was retracted cephalad and the cystic duct identified and freed as also the cystic artery. Both the artery and the duct and hemoclipped and cut. The common bile duct appeared to be of normal size. Gallbladder was dissected free from its bed using cautery for control of bleeding. This was then placed in a retrieval bag and brought out through the left upper quadrant port site multiple stones measuring up to a centimeter were noted. The right colon was mobilized with the use of the harmonic scalpel the hepatic flexure proximal transverse colon  and the right colon with the few adhesions here were taken down with the Harmonic scalpel and the cecum was also freed. Patient had a fairly long segment of transverse colon and right colon and estimated easy to manipulate and after the dissection was satisfactory and after mobilized to the midline and passed it from the right to left, hand port   was decided on. Leaving the rest reports in the umbilical port was removed and the incision extended low the umbilicus from about the umbilicus to allow for placement of a hand port approximating close to 5 cm. The peritoneal cavity was entered into and the hand port was position initial right colectomy is completed through this with the port acting as of 1 protector. The right colon was easily mobilized out the transverse colon site of transection in the terminal ileum sign of transection where marked and freed the and the intervening mesentery scored. The mesentery was then taken down with the use of the harmonic device until the main right colic artery and vein were identified. These were separately clamped cut and ligated with 2-0 silk. Remaining attachments of the mesentery was taken down with Harmonic scalpel. A side-to-side anastomosis with the GIA was then performed between the terminal ileum and the transverse colon. The remaining opening the bowel along with the the entire right colon was removed after transecting this and closing with the use of a second application of the GIA. The   staple line were reinforced with 3-0 silk in the corners and pressure points and noted that the anastomotic opening was adequate. The mesenteric opening was then  closed with 3-0 Vicryl. The right colon was then opened on a side table and the presence of the large polyp identified lying just below and to the side of the ileocecal valve.  Following this  attachment for a hand port was placed and a hand was introduced and pneumoperitoneum obtained. The gastric wall mass lying high up  on the lesser curvature was identified and held up with the hand and using the 12 mm port site in the left lower quadrant echelon stapler was introduced and 3 applications were used to do a wedge excision of this mass. This was a relatively small mass measuring up to 2 cm in maximal size. Previous endoscopic ultrasound revealed this to be likely a just but had not grown in the last couple years.. After this the fascia field was obtained and fresh gowns and gloves were utilized the closure of the incisions were then performed the 2 larger openings namely at the left upper quadrant and left lower quadrant however closed with the use of a suture passer using 0 Vicryl. Mid abdominal incision was then closed in one layer with the interrupted figure-of-eight stitches of 0 proline. Obtained ensuring hemostasis in the wounds are all sponges and instrument counts were declared correct 2 the skin was approximated with the staples including the port side areas. A honeycomb dressing was placed in the main incision and the others port site incision was closed with Telfa and Tegaderm. Procedure was well-tolerated with no immediate problems encountered. She was subsequently extubated and returned recovery room stable condition.

## 2015-04-19 NOTE — Anesthesia Procedure Notes (Signed)
Procedure Name: Intubation Date/Time: 04/19/2015 9:13 AM Performed by: [facility] Pre-anesthesia Checklist: Patient identified, Emergency Drugs available, Suction available, Patient being monitored and Timeout performed Patient Re-evaluated:Patient Re-evaluated prior to inductionOxygen Delivery Method: Circle system utilized Preoxygenation: Pre-oxygenation with 100% oxygen Intubation Type: IV induction Ventilation: Mask ventilation without difficulty Laryngoscope Size: 3 and McGraph Grade View: Grade I Tube type: Oral Tube size: 7.0 mm Number of attempts: 1 Airway Equipment and Method: Bougie stylet Placement Confirmation: ETT inserted through vocal cords under direct vision,  positive ETCO2,  CO2 detector and breath sounds checked- equal and bilateral Secured at: 21 cm Tube secured with: Tape Dental Injury: Teeth and Oropharynx as per pre-operative assessment  Difficulty Due To: Difficulty was anticipated, Difficult Airway- due to anterior larynx and Difficult Airway- due to limited oral opening Future Recommendations: Recommend- induction with short-acting agent, and alternative techniques readily available

## 2015-04-19 NOTE — Interval H&P Note (Signed)
History and Physical Interval Note:  04/19/2015 8:28 AM  Christine Burch  has presented today for surgery, with the diagnosis of X  The various methods of treatment have been discussed with the patient and family. After consideration of risks, benefits and other options for treatment, the patient has consented to  Procedure(s): LAPAROSCOPIC RIGHT COLECTOMY (Right) LAPAROSCOPIC CHOLECYSTECTOMY (N/A) EXCISION MASS ABDOMINAL/ EXCISION GASTRIC MASS (N/A) as a surgical intervention .  The patient's history has been reviewed, patient examined, no change in status, stable for surgery.  I have reviewed the patient's chart and labs.  Questions were answered to the patient's satisfaction.     Afshin Chrystal G

## 2015-04-19 NOTE — Transfer of Care (Signed)
Immediate Anesthesia Transfer of Care Note  Patient: Christine Burch  Procedure(s) Performed: Procedure(s): LAPAROSCOPIC RIGHT COLECTOMY (Right) LAPAROSCOPIC CHOLECYSTECTOMY (N/A) EXCISION MASS ABDOMINAL/ EXCISION GASTRIC MASS (N/A)  Patient Location: PACU  Anesthesia Type:General  Level of Consciousness: awake, alert  and oriented  Airway & Oxygen Therapy: Patient Spontanous Breathing and Patient connected to face mask oxygen  Post-op Assessment: Report given to RN  Post vital signs: Reviewed and stable  Last Vitals:  Filed Vitals:   04/19/15 0817 04/19/15 1224  BP: 127/70 116/62  Pulse: 77 74  Temp: 36.9 C 36.2 C  Resp: 14 15    Complications: No apparent anesthesia complications

## 2015-04-19 NOTE — Anesthesia Preprocedure Evaluation (Signed)
Anesthesia Evaluation  Patient identified by MRN, date of birth, ID band Patient awake    Reviewed: Allergy & Precautions, NPO status , Patient's Chart, lab work & pertinent test results  Airway Mallampati: III  TM Distance: <3 FB Neck ROM: Limited    Dental  (+) Chipped, Partial Upper   Pulmonary neg pulmonary ROS,    Pulmonary exam normal breath sounds clear to auscultation       Cardiovascular hypertension, Pt. on medications + CAD and + Peripheral Vascular Disease  Normal cardiovascular exam     Neuro/Psych Anxiety negative neurological ROS     GI/Hepatic Neg liver ROS, GERD  Medicated and Controlled,Colon and gastric polyps   Endo/Other  negative endocrine ROS  Renal/GU negative Renal ROS  negative genitourinary   Musculoskeletal negative musculoskeletal ROS (+)   Abdominal Normal abdominal exam  (+)   Peds negative pediatric ROS (+)  Hematology negative hematology ROS (+)   Anesthesia Other Findings Increased lymph nodes  Reproductive/Obstetrics                             Anesthesia Physical Anesthesia Plan  ASA: III  Anesthesia Plan: General   Post-op Pain Management:    Induction: Intravenous  Airway Management Planned: Oral ETT  Additional Equipment:   Intra-op Plan:   Post-operative Plan: Extubation in OR  Informed Consent: I have reviewed the patients History and Physical, chart, labs and discussed the procedure including the risks, benefits and alternatives for the proposed anesthesia with the patient or authorized representative who has indicated his/her understanding and acceptance.   Dental advisory given  Plan Discussed with: CRNA and Surgeon  Anesthesia Plan Comments:         Anesthesia Quick Evaluation

## 2015-04-20 ENCOUNTER — Encounter: Payer: Self-pay | Admitting: General Surgery

## 2015-04-20 LAB — CBC
HEMATOCRIT: 30.3 % — AB (ref 35.0–47.0)
Hemoglobin: 10.1 g/dL — ABNORMAL LOW (ref 12.0–16.0)
MCH: 27.3 pg (ref 26.0–34.0)
MCHC: 33.4 g/dL (ref 32.0–36.0)
MCV: 81.7 fL (ref 80.0–100.0)
Platelets: 181 10*3/uL (ref 150–440)
RBC: 3.71 MIL/uL — AB (ref 3.80–5.20)
RDW: 14 % (ref 11.5–14.5)
WBC: 11.4 10*3/uL — AB (ref 3.6–11.0)

## 2015-04-20 LAB — BASIC METABOLIC PANEL
Anion gap: 3 — ABNORMAL LOW (ref 5–15)
BUN: 7 mg/dL (ref 6–20)
CHLORIDE: 109 mmol/L (ref 101–111)
CO2: 24 mmol/L (ref 22–32)
Calcium: 8.1 mg/dL — ABNORMAL LOW (ref 8.9–10.3)
Creatinine, Ser: 0.45 mg/dL (ref 0.44–1.00)
GFR calc non Af Amer: >60 mL/min (ref >60)
Glucose, Bld: 187 mg/dL — ABNORMAL HIGH (ref 65–99)
POTASSIUM: 3.1 mmol/L — AB (ref 3.5–5.1)
SODIUM: 136 mmol/L (ref 135–145)

## 2015-04-20 MED ORDER — KCL IN DEXTROSE-NACL 30-5-0.45 MEQ/L-%-% IV SOLN
INTRAVENOUS | Status: DC
  Administered 2015-04-20 – 2015-04-22 (×4): via INTRAVENOUS
  Administered 2015-04-22: 1000 mL via INTRAVENOUS
  Filled 2015-04-20 (×9): qty 1000

## 2015-04-20 NOTE — Progress Notes (Signed)
Patient ID: Christine Burch, female   DOB: 11/28/1940, 75 y.o.   MRN: [id number] No complaints. AVSS. Excellent u/o. Labs- K 3.1, rest stable. Abdomen mildly distended, soft, good bs. Incision clean and intact. Lungs clear. Stable course. Encouraged oob and ambulation. Correct K.

## 2015-04-20 NOTE — Progress Notes (Signed)
Pt ambulated a total of 300 feet this morning outside of her room. Pt did very well. Will continue to encourage early ambulation.   Angus Seller

## 2015-04-20 NOTE — Anesthesia Postprocedure Evaluation (Signed)
Anesthesia Post Note  Patient: Christine Burch  Procedure(s) Performed: Procedure(s) (LRB): LAPAROSCOPIC RIGHT COLECTOMY (Right) LAPAROSCOPIC CHOLECYSTECTOMY (N/A) EXCISION MASS ABDOMINAL/ EXCISION GASTRIC MASS (N/A)  Patient location during evaluation: PACU Anesthesia Type: General Level of consciousness: awake and alert and oriented Pain management: pain level controlled Vital Signs Assessment: post-procedure vital signs reviewed and stable Respiratory status: spontaneous breathing Cardiovascular status: blood pressure returned to baseline Anesthetic complications: no    Last Vitals:  Filed Vitals:   04/19/15 2051 04/20/15 0412  BP: 117/54 120/48  Pulse: 83 78  Temp: 36.5 C 36.7 C  Resp: 16 18    Last Pain:  Filed Vitals:   04/20/15 1307  PainSc: 0-No pain                 Shaneisha Burkel

## 2015-04-21 LAB — BASIC METABOLIC PANEL
ANION GAP: 4 — AB (ref 5–15)
BUN: <5 mg/dL — ABNORMAL LOW (ref 6–20)
CALCIUM: 8.5 mg/dL — AB (ref 8.9–10.3)
CO2: 24 mmol/L (ref 22–32)
Chloride: 110 mmol/L (ref 101–111)
Creatinine, Ser: 0.43 mg/dL — ABNORMAL LOW (ref 0.44–1.00)
Glucose, Bld: 127 mg/dL — ABNORMAL HIGH (ref 65–99)
Potassium: 3.6 mmol/L (ref 3.5–5.1)
Sodium: 138 mmol/L (ref 135–145)

## 2015-04-21 NOTE — Care Management Important Message (Signed)
Important Message  Patient Details  Name: Christine Burch MRN: [id number] Date of Birth: 1940/12/01   Medicare Important Message Given:  Yes    Beverly Sessions, RN 11/23/202017, 10:53 AM

## 2015-04-21 NOTE — Progress Notes (Signed)
Patient ID: Christine Burch, female   DOB: [birth year], 75 y.o.   MRN: [id number] No complaints. No n/v. No flatus or bm yet. Abdomen is less distended today, softer, good bs. Incision clean and intact. K up to 3.6. Good u/o. Overall stable course. D/C foley, reduce IVF, clear liq diet

## 2015-04-21 NOTE — Care Management Note (Signed)
Case Management Note  Patient Details  Name: Mandip Shippey MRN: [id number] Date of Birth: 08/06/1940  Subjective/Objective:               Patient admitted status post Laparoscopy right colectomy, cholecystectomy and excision of gastric wall mass.  Patient lives at home with her husband.    Obtains her medications from [facility] in [place].  Adult daughter lives locally for support.  Patient has access to walker at home, but does not have a need.  Patient still drives herself.     Action/Plan: No RNCM needs identified at this time  Expected Discharge Date:                  Expected Discharge Plan:     In-House Referral:     Discharge planning Services     Post Acute Care Choice:    Choice offered to:     DME Arranged:    DME Agency:     HH Arranged:    [place] Agency:     Status of Service:     Medicare Important Message Given:  Yes Date Medicare IM Given:    Medicare IM give by:    Date Additional Medicare IM Given:    Additional Medicare Important Message give by:     If discussed at [place] of Stay Meetings, dates discussed:    Additional Comments:  Beverly Sessions, RN 2020-09-1215, 2:49 PM

## 2015-04-22 LAB — SURGICAL PATHOLOGY

## 2015-04-22 MED ORDER — ENOXAPARIN SODIUM 30 MG/0.3ML ~~LOC~~ SOLN: 30.0000 mg | SUBCUTANEOUS | Status: DC

## 2015-04-22 MED ORDER — ENOXAPARIN SODIUM 40 MG/0.4ML ~~LOC~~ SOLN
40.0000 mg | SUBCUTANEOUS | Status: DC
  Administered 2015-04-22: 40 mg via SUBCUTANEOUS
  Filled 2015-04-22: qty 0.4

## 2015-04-22 NOTE — Progress Notes (Deleted)
Pt has orders for lovenox 40mg  daily. Pt weight is <47kg. Therefore per protocol, will decrease dose to 30mg  q 24 hours.  Ramond Dial, Pharm.D Clinical Pharmacist

## 2015-04-23 MED ORDER — OXYCODONE-ACETAMINOPHEN 5-325 MG PO TABS: 1.0000 | ORAL_TABLET | ORAL | Status: DC | PRN

## 2015-04-23 NOTE — Progress Notes (Signed)
Patient discharged to home as ordered. IV discontinued site clean dry and intact. Patient instructed to follow up with Dr Jamal Collin on Monday 4/3 for staple removal. Patient is alert and oriented, no acute distress noted.

## 2015-04-23 NOTE — Discharge Summary (Signed)
Physician Discharge Summary  Patient ID: Christine Burch MRN: [id number] DOB/AGE: 07-21-1940 75 y.o.  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 Active Problems:   Adenomatous polyp of colon   Discharged Condition: good  Hospital Course: This 75 year old female was recently found to have a sizable polyp in the cecum which was not removable through the colonoscope. In addition patient was noted to have multiple gallstones and also had a less than 2 cm mass on the lesser curvature of the stomach wall. On 04/19/2015 the patient underwent a laparoscopy right hemicolectomy cholecystectomy and excision of the stomach wall mass. Procedure was uncomplicated as also the postoperative course. The patient is currently tolerating the full liquid diet and her bowels are started to function. She has no nausea vomiting. Pain control is adequate. The incision and port sites are healing satisfactorily. Final path report showed a tubular adenoma with no dysplasia in the cecum. The stomach wall mass was a benign leiomyoma in the gallbladder had chronic cholecystitis and cholelithiasis. Patient has been advised on the pathology report. Patient will return in 3 days for staple removal and follow-up to see me in about 10 days.  Consults: None  Significant Diagnostic Studies: labs: routine postop  Treatments: surgery: Laparoscopy right hemicolectomy, cholecystectomy and excision stomach wall mass   Discharge Exam: Blood pressure 112/62, pulse 89, temperature 98.7 F (37.1 C), temperature source Oral, resp. rate 18, height 5\' 2"  (1.575 m), weight 102 lb 9.6 oz (46.539 kg), SpO2 95 %. General appearance: alert and cooperative lungs are clear .abdomen is soft with good bowel sounds, incision and port sites are clean and healing  well.  Disposition: 01-Home or Self Care  Discharge Instructions    Call MD for:  persistant nausea and vomiting    Complete by:  As directed      Call MD for:  redness, tenderness, or signs of infection (pain, swelling, redness, odor or green/yellow discharge around incision site)    Complete by:  As directed      Call MD for:  severe uncontrolled pain    Complete by:  As directed      Call MD for:  temperature >100.4    Complete by:  As directed      Diet - low sodium heart healthy    Complete by:  As directed      Discharge instructions    Complete by:  As directed   No exertional activity. May shower.            Medication List    TAKE these medications        aspirin 81 MG tablet  Take 81 mg by mouth 2 (two) times daily.     clopidogrel 75 MG tablet  Commonly known as:  PLAVIX  Take 75 mg by mouth daily.     ezetimibe 10 MG tablet  Commonly known as:  ZETIA  Take 1 tablet (10 mg total) by mouth daily.     losartan 25 MG tablet  Commonly known as:  COZAAR  Take 1 tablet (25 mg total) by mouth daily.     metroNIDAZOLE 500 MG tablet  Commonly known as:  FLAGYL  Take one (1) tablet at 6 pm and one tablet (1) at 11 pm the evening prior to surgery.     neomycin 500 MG tablet  Commonly known as:  MYCIFRADIN  Take two (2) tablets at 6 pm and two tablets (2) at 11 pm the evening prior to surgery.     oxyCODONE-acetaminophen 5-325 MG tablet  Commonly known as:  ROXICET  Take 1 tablet by mouth every 4 (four) hours as needed.     polyethylene glycol powder powder  Commonly known as:  GLYCOLAX/MIRALAX  255 grams one bottle for colonoscopy prep     ranitidine 150 MG tablet  Commonly known as:  ZANTAC  Take 150 mg by mouth 2 (two) times daily.     rosuvastatin 40 MG tablet  Commonly known as:  CRESTOR  TAKE ONE TABLET BY MOUTH ONCE DAILY           Follow-up Information    Follow up with [name], MD. Schedule an appointment as soon as possible  for a visit in 3 days.   Specialties:  General Surgery, Radiology   Why:  staple removal   Contact information:   [place] [address] [phone]       Schedule an appointment as soon as possible for a visit with [name], MD.   Specialties:  General Surgery, Radiology   Why:  post op check   Contact information:   [address] [place] [address] [phone]       Signed: [name] 04/23/2015, 8:53 AM

## 2015-04-26 ENCOUNTER — Ambulatory Visit: Payer: Medicare Other | Admitting: *Deleted

## 2015-04-26 DIAGNOSIS — D126 Benign neoplasm of colon, unspecified: Secondary | ICD-10-CM

## 2015-04-26 DIAGNOSIS — K3189 Other diseases of stomach and duodenum: Secondary | ICD-10-CM

## 2015-04-26 NOTE — Progress Notes (Signed)
The staples were removed and steri strips applied. Patient doing well, area healing well. No signs of infection.

## 2015-04-28 ENCOUNTER — Ambulatory Visit: Payer: Medicare Other | Admitting: Unknown Physician Specialty

## 2015-05-03 ENCOUNTER — Encounter: Payer: Self-pay | Admitting: General Surgery

## 2015-05-03 ENCOUNTER — Ambulatory Visit (INDEPENDENT_AMBULATORY_CARE_PROVIDER_SITE_OTHER): Payer: Medicare Other | Admitting: General Surgery

## 2015-05-03 VITALS — BP 110/68 | HR 74 | Resp 14 | Ht 62.0 in | Wt 95.0 lb

## 2015-05-03 DIAGNOSIS — K8020 Calculus of gallbladder without cholecystitis without obstruction: Secondary | ICD-10-CM

## 2015-05-03 DIAGNOSIS — D214 Benign neoplasm of connective and other soft tissue of abdomen: Secondary | ICD-10-CM

## 2015-05-03 DIAGNOSIS — D126 Benign neoplasm of colon, unspecified: Secondary | ICD-10-CM

## 2015-05-03 NOTE — Patient Instructions (Signed)
Patient to return on one month. 

## 2015-05-03 NOTE — Progress Notes (Signed)
This is a 75 year old female here today for her post op right colectomy, gallbladder removal and gastric mass excision done on 04/19/15. Patient states she is doing well. I have reviewed the history of present illness with the patient.   Incision is clean and healing well. Abdomen soft,lungs are clear. Pathology: cecal poly-benign, no dysplasia. Gallstones. Leiomyoma stomach.  Patient to return in one month. May drive but no exertional activity    PCP:  Kathrine Haddock This information has been scribed by Gaspar Cola CMA.

## 2015-05-17 ENCOUNTER — Other Ambulatory Visit: Payer: Self-pay | Admitting: Unknown Physician Specialty

## 2015-05-18 ENCOUNTER — Other Ambulatory Visit: Payer: Self-pay | Admitting: Unknown Physician Specialty

## 2015-06-01 ENCOUNTER — Encounter: Payer: Self-pay | Admitting: General Surgery

## 2015-06-01 ENCOUNTER — Ambulatory Visit (INDEPENDENT_AMBULATORY_CARE_PROVIDER_SITE_OTHER): Payer: Medicare Other | Admitting: General Surgery

## 2015-06-01 VITALS — BP 116/70 | HR 68 | Resp 14 | Ht 62.0 in | Wt 94.0 lb

## 2015-06-01 DIAGNOSIS — K8020 Calculus of gallbladder without cholecystitis without obstruction: Secondary | ICD-10-CM

## 2015-06-01 DIAGNOSIS — K319 Disease of stomach and duodenum, unspecified: Secondary | ICD-10-CM

## 2015-06-01 DIAGNOSIS — D126 Benign neoplasm of colon, unspecified: Secondary | ICD-10-CM

## 2015-06-01 DIAGNOSIS — K3189 Other diseases of stomach and duodenum: Secondary | ICD-10-CM

## 2015-06-01 NOTE — Patient Instructions (Signed)
Return in one year for colonoscopy

## 2015-06-01 NOTE — Progress Notes (Signed)
This is a 75 year old female here today for her post op right colectomy, gallbladder removal and gastric mass excision done on 04/19/15. Patient states she is doing well. I have reviewed the history of present illness with the patient.  Incision is clean and well healed. Abdomen soft, lungs are clear. Patient to return to normal activity, no restrictions. Return in one year for surveillance colonoscopy.     PCP:  Kathrine Haddock This information has been scribed by Gaspar Cola CMA.

## 2015-06-07 DIAGNOSIS — I10 Essential (primary) hypertension: Secondary | ICD-10-CM | POA: Diagnosis not present

## 2015-06-07 DIAGNOSIS — H524 Presbyopia: Secondary | ICD-10-CM | POA: Diagnosis not present

## 2015-06-07 DIAGNOSIS — H40023 Open angle with borderline findings, high risk, bilateral: Secondary | ICD-10-CM | POA: Diagnosis not present

## 2015-06-07 DIAGNOSIS — H40053 Ocular hypertension, bilateral: Secondary | ICD-10-CM | POA: Diagnosis not present

## 2015-06-07 DIAGNOSIS — H52223 Regular astigmatism, bilateral: Secondary | ICD-10-CM | POA: Diagnosis not present

## 2015-06-23 ENCOUNTER — Other Ambulatory Visit: Payer: Self-pay

## 2015-06-23 MED ORDER — ROSUVASTATIN CALCIUM 40 MG PO TABS: 40.0000 mg | ORAL_TABLET | Freq: Every day | ORAL | Status: DC

## 2015-06-23 NOTE — Telephone Encounter (Signed)
Patient's husband called and left me a voicemail about patient's rosuvastatin. He states that she has taken 2 tablets in the past but the most recent rx says 1 tablet daily. They would like to know which it is supposed to be.

## 2015-06-23 NOTE — Telephone Encounter (Signed)
Called and spoke to patient's husband. I let him know Cheryl's directions for the medication and he stated that they would need a refill of it. Pharmacy is [facility] in [place] and patient has appointment 06/30/15.

## 2015-06-23 NOTE — Telephone Encounter (Signed)
It should be just once a day

## 2015-06-30 ENCOUNTER — Ambulatory Visit (INDEPENDENT_AMBULATORY_CARE_PROVIDER_SITE_OTHER): Payer: Medicare Other | Admitting: Unknown Physician Specialty

## 2015-06-30 ENCOUNTER — Encounter: Payer: Self-pay | Admitting: Unknown Physician Specialty

## 2015-06-30 VITALS — BP 129/61 | HR 76 | Temp 98.4°F | Ht 61.7 in | Wt 94.2 lb

## 2015-06-30 DIAGNOSIS — R1084 Generalized abdominal pain: Secondary | ICD-10-CM

## 2015-06-30 DIAGNOSIS — R634 Abnormal weight loss: Secondary | ICD-10-CM

## 2015-06-30 DIAGNOSIS — E7800 Pure hypercholesterolemia, unspecified: Secondary | ICD-10-CM

## 2015-06-30 DIAGNOSIS — I10 Essential (primary) hypertension: Secondary | ICD-10-CM

## 2015-06-30 DIAGNOSIS — D649 Anemia, unspecified: Secondary | ICD-10-CM | POA: Diagnosis not present

## 2015-06-30 DIAGNOSIS — F419 Anxiety disorder, unspecified: Secondary | ICD-10-CM | POA: Diagnosis not present

## 2015-06-30 DIAGNOSIS — I6522 Occlusion and stenosis of left carotid artery: Secondary | ICD-10-CM | POA: Diagnosis not present

## 2015-06-30 DIAGNOSIS — R413 Other amnesia: Secondary | ICD-10-CM

## 2015-06-30 MED ORDER — MIRTAZAPINE 15 MG PO TABS: 15.0000 mg | ORAL_TABLET | Freq: Every day | ORAL | Status: DC

## 2015-06-30 NOTE — Assessment & Plan Note (Signed)
Stable, continue present medications.   

## 2015-06-30 NOTE — Patient Instructions (Signed)
Take a daily Vit D supplement about 2,000 IUs/day I will get back to you on labs on Friday Start on Remeron which will help appetite, sleep, and mood Recheck in 1 month to check on memory.

## 2015-06-30 NOTE — Progress Notes (Signed)
BP 129/61 mmHg  Pulse 76  Temp(Src) 98.4 F (36.9 C)  Ht 5' 1.7" (1.567 m)  Wt 94 lb 3.2 oz (42.729 kg)  BMI 17.40 kg/m2  SpO2 99%  LMP  (LMP Unknown)   Subjective:    Patient ID: Christine Burch, female    DOB: June 30, 1940, 75 y.o.   MRN: [id number]  HPI: Christine Burch is a 75 y.o. female  Chief Complaint  Patient presents with  . Hypertension  . Hyperlipidemia   Pt is here with her husband  Pt recently had surgery for 2 polyps (needed surgical removal), gastric mass, and gall bladder.  Lost weight as was 118 pounds about 1 year ago  Husband feels she is not eating well. (Laparoscopy right colectomy, cholecystectomy and excision of gastric wall mass)  Hypertension Using medications without difficulty Average home BPs Not checking  No problems or lightheadedness No chest pain with exertion or shortness of breath No Edema  Hyperlipidemia Taking Crestor Using medications without problems: No Muscle aches  Diet compliance: Not eating well and not eating enough Exercise: None  Depression Husband feels she is depressed. Having trouble with memory for the last 2 years and worse following her surgery.  She is losing weight which she says makes her depressed.    Depression screen [facility] 2/9 06/30/2015 10/27/2014  Decreased Interest 1 0  Down, Depressed, Hopeless 1 0  PHQ - 2 Score 2 0  Altered sleeping 0 -  Tired, decreased energy 0 -  Change in appetite 1 -  Feeling bad or failure about yourself  0 -  Trouble concentrating 0 -  Moving slowly or fidgety/restless 1 -  Suicidal thoughts 0 -  PHQ-9 Score 4 -    Relevant past medical, surgical, family and social history reviewed and updated as indicated. Interim medical history since our last visit reviewed. Allergies and medications reviewed and updated.  Review of Systems  Per HPI unless specifically indicated above     Objective:    BP 129/61 mmHg  Pulse 76  Temp(Src) 98.4 F (36.9 C)  Ht 5' 1.7" (1.567 m)  Wt 94  lb 3.2 oz (42.729 kg)  BMI 17.40 kg/m2  SpO2 99%  LMP  (LMP Unknown)  Wt Readings from Last 3 Encounters:  06/30/15 94 lb 3.2 oz (42.729 kg)  06/01/15 94 lb (42.638 kg)  05/03/15 95 lb (43.092 kg)    Physical Exam  Constitutional: She is oriented to person, place, and time. She appears well-developed and well-nourished. No distress.  HENT:  Head: Normocephalic and atraumatic.  Eyes: Conjunctivae and lids are normal. Right eye exhibits no discharge. Left eye exhibits no discharge. No scleral icterus.  Neck: Normal range of motion. Neck supple. No JVD present. Carotid bruit is not present.  Cardiovascular: Normal rate, regular rhythm and normal heart sounds.   Pulmonary/Chest: Effort normal and breath sounds normal.  Abdominal: Normal appearance. There is no splenomegaly or hepatomegaly.  Musculoskeletal: Normal range of motion.  Neurological: She is alert and oriented to person, place, and time.  Skin: Skin is warm, dry and intact. No rash noted. No pallor.  Psychiatric: She has a normal mood and affect. Her behavior is normal. Judgment and thought content normal.   Mini mental 25/30.  Clock drawing is normal  Results for orders placed or performed during the hospital encounter of 04/19/15  CBC  Result Value Ref Range   WBC 11.9 (H) 3.6 - 11.0 K/uL   RBC 3.70 (L) 3.80 - 5.20 MIL/uL  Hemoglobin 10.4 (L) 12.0 - 16.0 g/dL   HCT 30.5 (L) 35.0 - 47.0 %   MCV 82.3 80.0 - 100.0 fL   MCH 27.9 26.0 - 34.0 pg   MCHC 34.0 32.0 - 36.0 g/dL   RDW 14.4 11.5 - 14.5 %   Platelets 179 150 - 440 K/uL  Creatinine, serum  Result Value Ref Range   Creatinine, Ser 0.58 0.44 - 1.00 mg/dL   GFR calc non Af Amer >60 >60 mL/min   GFR calc Af Amer >60 >60 mL/min  Basic metabolic panel  Result Value Ref Range   Sodium 136 135 - 145 mmol/L   Potassium 3.1 (L) 3.5 - 5.1 mmol/L   Chloride 109 101 - 111 mmol/L   CO2 24 22 - 32 mmol/L   Glucose, Bld 187 (H) 65 - 99 mg/dL   BUN 7 6 - 20 mg/dL    Creatinine, Ser 0.45 0.44 - 1.00 mg/dL   Calcium 8.1 (L) 8.9 - 10.3 mg/dL   GFR calc non Af Amer >60 >60 mL/min   GFR calc Af Amer >60 >60 mL/min   Anion gap 3 (L) 5 - 15  CBC  Result Value Ref Range   WBC 11.4 (H) 3.6 - 11.0 K/uL   RBC 3.71 (L) 3.80 - 5.20 MIL/uL   Hemoglobin 10.1 (L) 12.0 - 16.0 g/dL   HCT 30.3 (L) 35.0 - 47.0 %   MCV 81.7 80.0 - 100.0 fL   MCH 27.3 26.0 - 34.0 pg   MCHC 33.4 32.0 - 36.0 g/dL   RDW 14.0 11.5 - 14.5 %   Platelets 181 150 - 440 K/uL  Basic metabolic panel  Result Value Ref Range   Sodium 138 135 - 145 mmol/L   Potassium 3.6 3.5 - 5.1 mmol/L   Chloride 110 101 - 111 mmol/L   CO2 24 22 - 32 mmol/L   Glucose, Bld 127 (H) 65 - 99 mg/dL   BUN <5 (L) 6 - 20 mg/dL   Creatinine, Ser 0.43 (L) 0.44 - 1.00 mg/dL   Calcium 8.5 (L) 8.9 - 10.3 mg/dL   GFR calc non Af Amer >60 >60 mL/min   GFR calc Af Amer >60 >60 mL/min   Anion gap 4 (L) 5 - 15  Surgical pathology  Result Value Ref Range   SURGICAL PATHOLOGY      Surgical Pathology CASE: [id number] PATIENT: Christine Burch Surgical Pathology Report     SPECIMEN SUBMITTED: A. Gallbladder B. Colon, right C. Wedge resection gastric wall mass  CLINICAL HISTORY: None provided  PRE-OPERATIVE DIAGNOSIS: Right colon polyp, gastrointestinal tumor  POST-OPERATIVE DIAGNOSIS: Same as pre-op     DIAGNOSIS: A. GALLBLADDER; CHOLECYSTECTOMY: - CHOLELITHIASIS AND CHRONIC CHOLECYSTITIS.  B. TERMINAL ILEUM AND RIGHT COLON; RIGHT COLECTOMY: - TUBULOVILLOUS ADENOMA, 1.8 CM, IN CECUM. - NEGATIVE FOR HIGH-GRADE DYSPLASIA AND MALIGNANCY. - MARGINS CLEAR. - 14 BENIGN REGIONAL LYMPH NODES.  C. STOMACH; WEDGE RESECTION: - LEIOMYOMA.  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 There is no atypia, mitotic activity, or necrosis, and it appears to have been entirely removed. Immunohistochemistry (IHC) was performed for further  characterization with the following results:  [place]min: Positive, diffuse strong staining of neoplastic cells Smooth muscle actin: Positive, diffuse moderate staining of neoplastic cells DOG-1: Negative in neoplastic cells, scattered positive interstitial cells CD117 (KIT): Negative in neoplastic cells, scattered positive interstitial cells CD34: Negative in neoplastic cells, scattered positive interstitial cells and vessels S100:  Negative The IHC staining profile supports the above diagnosis.  IHC slides were prepared by [facility], [facility], [facility], and interpreted by Dr. Dicie Beam. All controls stained appropriately.   GROSS DESCRIPTION: A. Labeled: gallbladder Size of specimen: 5.9 x 2.2 x 1.6 cm Previously opened: focally External surface: wrinkled pink-tan Wall thickness: 0.2 cm Mucosa: velvety pink-tan Stones present: yes, multiple multifaceted green, aggregate 2.0 x 1.5 x 1.0 cm  Other findings: cystic duct margin inked blue  Block summary: 1 - representative section  and en face cystic duct margin   B. Labeled: right colon Type of procedure: right colectomy Measurement: terminal ileum - 6.2 centimeters long by up to 2 centimeter in diameter; colon - 16 cm long by up to 4.5 cm in diameter Specimen integrity: received open Orientation: radial/mesenteric margin inked orange Remaining external surface inked blue Number of mass(es): 1 Location of mass: cecal pouch Size of mass: 1.8 x 1.5 x 1.0 cm Description of mass: polypoid pink to red Bowel circumference at site of mass: 4.2 cm Gross depth of invasion: no gross invasion Macroscopic perforation: not identified Margins:      Proximal: 5.6 cm Distal: 18.2 cm Circumferential (Radial): 5.6 cm Mesenteric: 5.6 cm  Luminal obstruction: 40% Other findings: appendix absent Lymph nodes: identified Block summary: 1-en face proximal margin 2-en face distal margin 3-perpendicular radial  margin 4-7-entire polypoid tumor 8-4 lymph node candidates 9-4 lymph node candidates 10-4 lymph  node candidates 11-3 lymph node candidates  C. Labeled: wedge resection gastric wall mass Tissue fragment(s): 1 Size: 5.0 x 2.2 x 1.5 cm  Description: wedge of stomach with a 5 cm long 0.3 cm in diameter staple line, serosa predominantly smooth pink-tan with focal ecchymosis and fibrous adhesion, staple margin removed and parenchymal margin underlying staple margin inked green. Sectioning reveals an ovoid well-defined 1.7 x 1.7 x 1.5 cm yellow to tan whorled nodule that is 0.2 cm from green inked parenchymal margin and is grossly contained within serosa  1-3-Representative mass with perpendicular closest parenchymal margin  Final Diagnosis performed by Bryan Lemma, MD.  Electronically signed 04/22/2015 12:19:35PM    The electronic signature indicates that the named Attending Pathologist has evaluated the specimen  Technical component performed at [facility], [address], [place], [place] [address] Lab: [phone] Dir: Darrick Penna. Evette Doffing, MD  Professional compon ent performed at [facility], [facility], [place], [place], [place] [address] Lab: [phone] Dir: Dellia Nims. Rubinas, MD        Assessment & Plan:   Problem List Items Addressed This Visit      Unprioritized   Chronic anxiety   Relevant Medications   mirtazapine (REMERON) 15 MG tablet   High cholesterol - Primary   Relevant Orders   Lipid Panel w/o Chol/HDL Ratio   Hypertension   Relevant Orders   Comprehensive metabolic panel    Other Visit Diagnoses    Memory loss        Relevant Orders    Comprehensive metabolic panel    UA/M w/rflx Culture, Routine    Abnormal weight loss        Relevant Orders    CBC with Differential/Platelet    TSH    Generalized abdominal pain        Relevant Orders    H.pylori screen, POC    Anemia, unspecified anemia type        Vitamin D deficiency  Follow up plan: Return in about 4 weeks (around 07/28/2015).

## 2015-06-30 NOTE — Assessment & Plan Note (Signed)
Check cholesterol.  Consider decreasing to 20 mg

## 2015-07-01 LAB — UA/M W/RFLX CULTURE, ROUTINE
Bilirubin, UA: NEGATIVE
GLUCOSE, UA: NEGATIVE
Ketones, UA: NEGATIVE
NITRITE UA: NEGATIVE
PH UA: 7 (ref 5.0–7.5)
PROTEIN UA: NEGATIVE
Specific Gravity, UA: 1.015 (ref 1.005–1.030)
Urobilinogen, Ur: 0.2 mg/dL (ref 0.2–1.0)

## 2015-07-01 LAB — CBC WITH DIFFERENTIAL/PLATELET
BASOS: 1 %
Basophils Absolute: 0 10*3/uL (ref 0.0–0.2)
EOS (ABSOLUTE): 0.1 10*3/uL (ref 0.0–0.4)
EOS: 2 %
Hematocrit: 31.3 % — ABNORMAL LOW (ref 34.0–46.6)
Hemoglobin: 9.4 g/dL — ABNORMAL LOW (ref 11.1–15.9)
IMMATURE GRANS (ABS): 0 10*3/uL (ref 0.0–0.1)
IMMATURE GRANULOCYTES: 0 %
Lymphocytes Absolute: 0.9 10*3/uL (ref 0.7–3.1)
Lymphs: 19 %
MCH: 24.4 pg — ABNORMAL LOW (ref 26.6–33.0)
MCHC: 30 g/dL — ABNORMAL LOW (ref 31.5–35.7)
MCV: 81 fL (ref 79–97)
Monocytes Absolute: 0.5 10*3/uL (ref 0.1–0.9)
Monocytes: 10 %
NEUTROS PCT: 68 %
Neutrophils Absolute: 3.1 10*3/uL (ref 1.4–7.0)
PLATELETS: 216 10*3/uL (ref 150–379)
RBC: 3.86 x10E6/uL (ref 3.77–5.28)
RDW: 14 % (ref 12.3–15.4)
WBC: 4.5 10*3/uL (ref 3.4–10.8)

## 2015-07-01 LAB — LIPID PANEL W/O CHOL/HDL RATIO
Cholesterol, Total: 137 mg/dL (ref 100–199)
HDL: 63 mg/dL (ref >39)
LDL CALC: 51 mg/dL (ref 0–99)
TRIGLYCERIDES: 116 mg/dL (ref 0–149)
VLDL Cholesterol Cal: 23 mg/dL (ref 5–40)

## 2015-07-01 LAB — COMPREHENSIVE METABOLIC PANEL
ALBUMIN: 4.1 g/dL (ref 3.5–4.8)
ALK PHOS: 55 IU/L (ref 39–117)
ALT: 12 IU/L (ref 0–32)
AST: 17 IU/L (ref 0–40)
Albumin/Globulin Ratio: 1.5 (ref 1.2–2.2)
BILIRUBIN TOTAL: 0.8 mg/dL (ref 0.0–1.2)
BUN/Creatinine Ratio: 18 (ref 12–28)
BUN: 12 mg/dL (ref 8–27)
CO2: 24 mmol/L (ref 18–29)
Calcium: 9.7 mg/dL (ref 8.7–10.3)
Chloride: 106 mmol/L (ref 96–106)
Creatinine, Ser: 0.68 mg/dL (ref 0.57–1.00)
GFR calc Af Amer: 101 mL/min/{1.73_m2} (ref >59)
GFR, EST NON AFRICAN AMERICAN: 88 mL/min/{1.73_m2} (ref >59)
GLUCOSE: 100 mg/dL — AB (ref 65–99)
Globulin, Total: 2.7 g/dL (ref 1.5–4.5)
POTASSIUM: 5.2 mmol/L (ref 3.5–5.2)
Sodium: 142 mmol/L (ref 134–144)
Total Protein: 6.8 g/dL (ref 6.0–8.5)

## 2015-07-01 LAB — TSH: TSH: 2.56 u[IU]/mL (ref 0.450–4.500)

## 2015-07-01 LAB — MICROSCOPIC EXAMINATION

## 2015-07-01 LAB — URINE CULTURE, REFLEX: ORGANISM ID, BACTERIA: NO GROWTH

## 2015-07-30 ENCOUNTER — Ambulatory Visit (INDEPENDENT_AMBULATORY_CARE_PROVIDER_SITE_OTHER): Payer: Medicare Other | Admitting: Unknown Physician Specialty

## 2015-07-30 ENCOUNTER — Encounter: Payer: Self-pay | Admitting: Unknown Physician Specialty

## 2015-07-30 VITALS — BP 111/67 | HR 74 | Temp 99.0°F | Ht 61.0 in | Wt 97.0 lb

## 2015-07-30 DIAGNOSIS — R636 Underweight: Secondary | ICD-10-CM

## 2015-07-30 DIAGNOSIS — G3184 Mild cognitive impairment, so stated: Secondary | ICD-10-CM | POA: Diagnosis not present

## 2015-07-30 DIAGNOSIS — I6522 Occlusion and stenosis of left carotid artery: Secondary | ICD-10-CM | POA: Diagnosis not present

## 2015-07-30 DIAGNOSIS — F0280 Dementia in other diseases classified elsewhere without behavioral disturbance: Secondary | ICD-10-CM | POA: Insufficient documentation

## 2015-07-30 DIAGNOSIS — D509 Iron deficiency anemia, unspecified: Secondary | ICD-10-CM | POA: Diagnosis not present

## 2015-07-30 DIAGNOSIS — F419 Anxiety disorder, unspecified: Secondary | ICD-10-CM

## 2015-07-30 DIAGNOSIS — G3109 Other frontotemporal dementia: Secondary | ICD-10-CM

## 2015-07-30 MED ORDER — MIRTAZAPINE 15 MG PO TABS: 15.0000 mg | ORAL_TABLET | Freq: Every day | ORAL | Status: DC

## 2015-07-30 NOTE — Assessment & Plan Note (Signed)
Taking iron.  Check CBC

## 2015-07-30 NOTE — Assessment & Plan Note (Signed)
27/30 on mini mental.  Will continue to monitor

## 2015-07-30 NOTE — Assessment & Plan Note (Signed)
Continue Remeron 

## 2015-07-30 NOTE — Progress Notes (Signed)
BP 111/67 mmHg  Pulse 74  Temp(Src) 99 F (37.2 C)  Ht 5\' 1"  (1.549 m)  Wt 97 lb (43.999 kg)  BMI 18.34 kg/m2  SpO2 98%  LMP  (LMP Unknown)   Subjective:    Patient ID: Christine Burch, female    DOB: 08-08-1940, 75 y.o.   MRN: [id number]  HPI: Christine Burch is a 75 y.o. female  Chief Complaint  Patient presents with  . Memory Loss   Last visit started Remeron for depression, sleep, and weight loss.  States appetite is better, she is sleeping better, and has an improved mood.  She does forget "a few things."    Iron deficiency anemia Probably due to surgeries.  She is taking 1 iron pill/day  Relevant past medical, surgical, family and social history reviewed and updated as indicated. Interim medical history since our last visit reviewed. Allergies and medications reviewed and updated.  Review of Systems  Per HPI unless specifically indicated above     Objective:    BP 111/67 mmHg  Pulse 74  Temp(Src) 99 F (37.2 C)  Ht 5\' 1"  (1.549 m)  Wt 97 lb (43.999 kg)  BMI 18.34 kg/m2  SpO2 98%  LMP  (LMP Unknown)  Wt Readings from Last 3 Encounters:  07/30/15 97 lb (43.999 kg)  06/30/15 94 lb 3.2 oz (42.729 kg)  06/01/15 94 lb (42.638 kg)    Physical Exam  Constitutional: She is oriented to person, place, and time. She appears well-developed and well-nourished. No distress.  HENT:  Head: Normocephalic and atraumatic.  Eyes: Conjunctivae and lids are normal. Right eye exhibits no discharge. Left eye exhibits no discharge. No scleral icterus.  Neck: Normal range of motion. Neck supple. No JVD present. Carotid bruit is not present.  Cardiovascular: Normal rate, regular rhythm and normal heart sounds.   Pulmonary/Chest: Effort normal and breath sounds normal.  Abdominal: Normal appearance. There is no splenomegaly or hepatomegaly.  Musculoskeletal: Normal range of motion.  Neurological: She is alert and oriented to person, place, and time.  Skin: Skin is warm, dry and  intact. No rash noted. No pallor.  Psychiatric: She has a normal mood and affect. Her behavior is normal. Judgment and thought content normal.   MME 27/30 Results for orders placed or performed in visit on 06/30/15  Microscopic Examination  Result Value Ref Range   WBC, UA 0-5 0 -  5 /hpf   RBC, UA 0-2 0 -  2 /hpf   Epithelial Cells (non renal) 0-10 0 - 10 /hpf   Mucus, UA Present Not Estab.   Bacteria, UA Few None seen/Few  Lipid Panel w/o Chol/HDL Ratio  Result Value Ref Range   Cholesterol, Total 137 100 - 199 mg/dL   Triglycerides 116 0 - 149 mg/dL   HDL 63 >39 mg/dL   VLDL Cholesterol Cal 23 5 - 40 mg/dL   LDL Calculated 51 0 - 99 mg/dL  Comprehensive metabolic panel  Result Value Ref Range   Glucose 100 (H) 65 - 99 mg/dL   BUN 12 8 - 27 mg/dL   Creatinine, Ser 0.68 0.57 - 1.00 mg/dL   GFR calc non Af Amer 88 >59 mL/min/1.73   GFR calc Af Amer 101 >59 mL/min/1.73   BUN/Creatinine Ratio 18 12 - 28   Sodium 142 134 - 144 mmol/L   Potassium 5.2 3.5 - 5.2 mmol/L   Chloride 106 96 - 106 mmol/L   CO2 24 18 - 29 mmol/L   Calcium  9.7 8.7 - 10.3 mg/dL   Total Protein 6.8 6.0 - 8.5 g/dL   Albumin 4.1 3.5 - 4.8 g/dL   Globulin, Total 2.7 1.5 - 4.5 g/dL   Albumin/Globulin Ratio 1.5 1.2 - 2.2   Bilirubin Total 0.8 0.0 - 1.2 mg/dL   Alkaline Phosphatase 55 39 - 117 IU/L   AST 17 0 - 40 IU/L   ALT 12 0 - 32 IU/L  CBC with Differential/Platelet  Result Value Ref Range   WBC 4.5 3.4 - 10.8 x10E3/uL   RBC 3.86 3.77 - 5.28 x10E6/uL   Hemoglobin 9.4 (L) 11.1 - 15.9 g/dL   Hematocrit 31.3 (L) 34.0 - 46.6 %   MCV 81 79 - 97 fL   MCH 24.4 (L) 26.6 - 33.0 pg   MCHC 30.0 (L) 31.5 - 35.7 g/dL   RDW 14.0 12.3 - 15.4 %   Platelets 216 150 - 379 x10E3/uL   Neutrophils 68 %   Lymphs 19 %   Monocytes 10 %   Eos 2 %   Basos 1 %   Neutrophils Absolute 3.1 1.4 - 7.0 x10E3/uL   Lymphocytes Absolute 0.9 0.7 - 3.1 x10E3/uL   Monocytes Absolute 0.5 0.1 - 0.9 x10E3/uL   EOS (ABSOLUTE) 0.1  0.0 - 0.4 x10E3/uL   Basophils Absolute 0.0 0.0 - 0.2 x10E3/uL   Immature Granulocytes 0 %   Immature Grans (Abs) 0.0 0.0 - 0.1 x10E3/uL  TSH  Result Value Ref Range   TSH 2.560 0.450 - 4.500 uIU/mL  UA/M w/rflx Culture, Routine  Result Value Ref Range   Specific Gravity, UA 1.015 1.005 - 1.030   pH, UA 7.0 5.0 - 7.5   Color, UA Yellow Yellow   Appearance Ur Clear Clear   Leukocytes, UA 1+ (A) Negative   Protein, UA Negative Negative/Trace   Glucose, UA Negative Negative   Ketones, UA Negative Negative   RBC, UA Trace (A) Negative   Bilirubin, UA Negative Negative   Urobilinogen, Ur 0.2 0.2 - 1.0 mg/dL   Nitrite, UA Negative Negative   Microscopic Examination See below:    Urinalysis Reflex Comment   Urine Culture, Routine  Result Value Ref Range   Urine Culture, Routine Final report    Urine Culture result 1 No growth       Assessment & Plan:   Problem List Items Addressed This Visit      Unprioritized   Chronic anxiety    Continue Remeron      Relevant Medications   mirtazapine (REMERON) 15 MG tablet   Iron deficiency anemia    Taking iron.  Check CBC      Relevant Medications   Ferrous Sulfate (SLOW RELEASE IRON PO)   Other Relevant Orders   CBC with Differential/Platelet   MCI (mild cognitive impairment)    27/30 on mini mental.  Will continue to monitor      Underweight - Primary    Continue Remeron          Follow up plan: Return in about 3 months (around 10/30/2015) for MCI and anxiety.

## 2015-07-31 LAB — CBC WITH DIFFERENTIAL/PLATELET
BASOS: 1 %
Basophils Absolute: 0 10*3/uL (ref 0.0–0.2)
EOS (ABSOLUTE): 0.1 10*3/uL (ref 0.0–0.4)
Eos: 2 %
HEMATOCRIT: 33.9 % — AB (ref 34.0–46.6)
Hemoglobin: 10.3 g/dL — ABNORMAL LOW (ref 11.1–15.9)
IMMATURE GRANULOCYTES: 0 %
Immature Grans (Abs): 0 10*3/uL (ref 0.0–0.1)
LYMPHS ABS: 1.1 10*3/uL (ref 0.7–3.1)
Lymphs: 20 %
MCH: 24.8 pg — ABNORMAL LOW (ref 26.6–33.0)
MCHC: 30.4 g/dL — AB (ref 31.5–35.7)
MCV: 82 fL (ref 79–97)
MONOS ABS: 0.5 10*3/uL (ref 0.1–0.9)
Monocytes: 9 %
NEUTROS ABS: 3.8 10*3/uL (ref 1.4–7.0)
Neutrophils: 68 %
Platelets: 214 10*3/uL (ref 150–379)
RBC: 4.15 x10E6/uL (ref 3.77–5.28)
RDW: 17.5 % — AB (ref 12.3–15.4)
WBC: 5.5 10*3/uL (ref 3.4–10.8)

## 2015-09-14 DIAGNOSIS — Z9889 Other specified postprocedural states: Secondary | ICD-10-CM | POA: Diagnosis not present

## 2015-09-14 DIAGNOSIS — I679 Cerebrovascular disease, unspecified: Secondary | ICD-10-CM | POA: Diagnosis not present

## 2015-09-14 DIAGNOSIS — E7800 Pure hypercholesterolemia, unspecified: Secondary | ICD-10-CM | POA: Diagnosis not present

## 2015-09-14 DIAGNOSIS — I10 Essential (primary) hypertension: Secondary | ICD-10-CM | POA: Diagnosis not present

## 2015-09-21 ENCOUNTER — Other Ambulatory Visit: Payer: Self-pay | Admitting: Unknown Physician Specialty

## 2015-09-21 NOTE — Telephone Encounter (Signed)
Your patient 

## 2015-10-14 DIAGNOSIS — H25813 Combined forms of age-related cataract, bilateral: Secondary | ICD-10-CM | POA: Diagnosis not present

## 2015-10-14 DIAGNOSIS — H524 Presbyopia: Secondary | ICD-10-CM | POA: Diagnosis not present

## 2015-11-02 ENCOUNTER — Ambulatory Visit: Payer: Medicare Other | Admitting: Unknown Physician Specialty

## 2015-11-08 ENCOUNTER — Ambulatory Visit (INDEPENDENT_AMBULATORY_CARE_PROVIDER_SITE_OTHER): Payer: Medicare Other | Admitting: Unknown Physician Specialty

## 2015-11-08 ENCOUNTER — Encounter: Payer: Self-pay | Admitting: Unknown Physician Specialty

## 2015-11-08 VITALS — BP 109/68 | HR 73 | Temp 98.6°F | Ht 61.8 in | Wt 98.6 lb

## 2015-11-08 DIAGNOSIS — D500 Iron deficiency anemia secondary to blood loss (chronic): Secondary | ICD-10-CM | POA: Diagnosis not present

## 2015-11-08 DIAGNOSIS — I6522 Occlusion and stenosis of left carotid artery: Secondary | ICD-10-CM | POA: Diagnosis not present

## 2015-11-08 DIAGNOSIS — G3184 Mild cognitive impairment, so stated: Secondary | ICD-10-CM

## 2015-11-08 LAB — CBC WITH DIFFERENTIAL/PLATELET
Hematocrit: 39 % (ref 34.0–46.6)
Hemoglobin: 12.9 g/dL (ref 11.1–15.9)
LYMPHS: 18 %
Lymphocytes Absolute: 1 10*3/uL (ref 0.7–3.1)
MCH: 29.1 pg (ref 26.6–33.0)
MCHC: 33.1 g/dL (ref 31.5–35.7)
MCV: 88 fL (ref 79–97)
MID (Absolute): 0.6 10*3/uL (ref 0.1–1.6)
MID: 10 %
NEUTROS ABS: 4.1 10*3/uL (ref 1.4–7.0)
NEUTROS PCT: 71 %
PLATELETS: 212 10*3/uL (ref 150–379)
RBC: 4.44 x10E6/uL (ref 3.77–5.28)
RDW: 15.4 % (ref 12.3–15.4)
WBC: 5.7 10*3/uL (ref 3.4–10.8)

## 2015-11-08 NOTE — Patient Instructions (Addendum)
Add more fat to diet with olive oil, nuts, seeds, avocados.    Use Dark Green leafy vegetables.

## 2015-11-08 NOTE — Assessment & Plan Note (Addendum)
H/H normal.  Stop iron.  Recheck in 6 weeks

## 2015-11-08 NOTE — Assessment & Plan Note (Signed)
Poor short term memory.  Refusing neurology visit at this time.

## 2015-11-08 NOTE — Progress Notes (Signed)
BP 109/68 (BP Location: Left Arm, Patient Position: Sitting, Cuff Size: Small)   Pulse 73   Temp 98.6 F (37 C)   Ht 5' 1.8" (1.57 m)   Wt 98 lb 9.6 oz (44.7 kg)   LMP  (LMP Unknown)   SpO2 97%   BMI 18.15 kg/m    Subjective:    Patient ID: Christine Burch, female    DOB: [birth year], 75 y.o.   MRN: [id number]  HPI: Christine Burch is a 75 y.o. female  Chief Complaint  Patient presents with  . MCI  . Anxiety   Anxiety Much improved with Remeron.  She is eating better and maintaining her weight.    Memory Husband states it is about the same.  MM is 37/40  Anemia Taking iron twice a day.  Anemic probably due to surgeries.  Recheck CBC today.  Relevant past medical, surgical, family and social history reviewed and updated as indicated. Interim medical history since our last visit reviewed. Allergies and medications reviewed and updated.  Review of Systems  Per HPI unless specifically indicated above     Objective:    BP 109/68 (BP Location: Left Arm, Patient Position: Sitting, Cuff Size: Small)   Pulse 73   Temp 98.6 F (37 C)   Ht 5' 1.8" (1.57 m)   Wt 98 lb 9.6 oz (44.7 kg)   LMP  (LMP Unknown)   SpO2 97%   BMI 18.15 kg/m   Wt Readings from Last 3 Encounters:  11/08/15 98 lb 9.6 oz (44.7 kg)  07/30/15 97 lb (44 kg)  06/30/15 94 lb 3.2 oz (42.7 kg)    Physical Exam  Constitutional: She is oriented to person, place, and time. She appears well-developed and well-nourished. No distress.  HENT:  Head: Normocephalic and atraumatic.  Eyes: Conjunctivae and lids are normal. Right eye exhibits no discharge. Left eye exhibits no discharge. No scleral icterus.  Neck: Normal range of motion. Neck supple. No JVD present. Carotid bruit is not present.  Cardiovascular: Normal rate, regular rhythm and normal heart sounds.   Pulmonary/Chest: Effort normal and breath sounds normal.  Abdominal: Normal appearance. There is no splenomegaly or hepatomegaly.  Musculoskeletal:  Normal range of motion.  Neurological: She is alert and oriented to person, place, and time.  Skin: Skin is warm, dry and intact. No rash noted. No pallor.  Psychiatric: She has a normal mood and affect. Her behavior is normal. Judgment and thought content normal.    Results for orders placed or performed in visit on 07/30/15  CBC with Differential/Platelet  Result Value Ref Range   WBC 5.5 3.4 - 10.8 x10E3/uL   RBC 4.15 3.77 - 5.28 x10E6/uL   Hemoglobin 10.3 (L) 11.1 - 15.9 g/dL   Hematocrit 33.9 (L) 34.0 - 46.6 %   MCV 82 79 - 97 fL   MCH 24.8 (L) 26.6 - 33.0 pg   MCHC 30.4 (L) 31.5 - 35.7 g/dL   RDW 17.5 (H) 12.3 - 15.4 %   Platelets 214 150 - 379 x10E3/uL   Neutrophils 68 %   Lymphs 20 %   Monocytes 9 %   Eos 2 %   Basos 1 %   Neutrophils Absolute 3.8 1.4 - 7.0 x10E3/uL   Lymphocytes Absolute 1.1 0.7 - 3.1 x10E3/uL   Monocytes Absolute 0.5 0.1 - 0.9 x10E3/uL   EOS (ABSOLUTE) 0.1 0.0 - 0.4 x10E3/uL   Basophils Absolute 0.0 0.0 - 0.2 x10E3/uL   Immature Granulocytes 0 %  Immature Grans (Abs) 0.0 0.0 - 0.1 x10E3/uL      Assessment & Plan:   Problem List Items Addressed This Visit      Unprioritized   Iron deficiency anemia - Primary    H/H normal.  Stop iron.  Recheck in 6 weeks      Relevant Orders   CBC With Differential/Platelet   Iron and TIBC   Ferritin   MCI (mild cognitive impairment)    Poor short term memory.  Refusing neurology visit at this time.         Other Visit Diagnoses   None.      Follow up plan: Return in about 6 weeks (around 12/20/2015) for CBC.

## 2015-11-09 LAB — IRON AND TIBC
IRON: 63 ug/dL (ref 27–139)
Iron Saturation: 23 % (ref 15–55)
TIBC: 269 ug/dL (ref 250–450)
UIBC: 206 ug/dL (ref 118–369)

## 2015-11-09 LAB — FERRITIN: FERRITIN: 160 ng/mL — AB (ref 15–150)

## 2015-11-15 ENCOUNTER — Other Ambulatory Visit: Payer: Self-pay | Admitting: Unknown Physician Specialty

## 2015-11-16 ENCOUNTER — Telehealth: Payer: Self-pay

## 2015-11-16 NOTE — Telephone Encounter (Signed)
-----   Message from Stark Klein sent at 11/16/2015  9:38 AM EDT ----- pts ZETIA 10 MG tablet says it was confirmed by pharmacy but pt stated that it wasn't there. Do you mind calling this in.

## 2015-11-16 NOTE — Telephone Encounter (Signed)
Called pharmacy to call in the medication for the patient. Pharmacy said that they just got the rx this morning and that they would call the patient and let her know.

## 2015-11-23 ENCOUNTER — Other Ambulatory Visit: Payer: Self-pay | Admitting: Unknown Physician Specialty

## 2015-12-20 ENCOUNTER — Encounter: Payer: Self-pay | Admitting: Unknown Physician Specialty

## 2015-12-20 ENCOUNTER — Ambulatory Visit (INDEPENDENT_AMBULATORY_CARE_PROVIDER_SITE_OTHER): Payer: Medicare Other | Admitting: Unknown Physician Specialty

## 2015-12-20 VITALS — BP 108/65 | HR 68 | Temp 98.1°F | Wt 98.0 lb

## 2015-12-20 DIAGNOSIS — Z1239 Encounter for other screening for malignant neoplasm of breast: Secondary | ICD-10-CM

## 2015-12-20 DIAGNOSIS — E7800 Pure hypercholesterolemia, unspecified: Secondary | ICD-10-CM

## 2015-12-20 DIAGNOSIS — F419 Anxiety disorder, unspecified: Secondary | ICD-10-CM | POA: Diagnosis not present

## 2015-12-20 DIAGNOSIS — I6522 Occlusion and stenosis of left carotid artery: Secondary | ICD-10-CM | POA: Diagnosis not present

## 2015-12-20 DIAGNOSIS — D500 Iron deficiency anemia secondary to blood loss (chronic): Secondary | ICD-10-CM | POA: Diagnosis not present

## 2015-12-20 DIAGNOSIS — Z1231 Encounter for screening mammogram for malignant neoplasm of breast: Secondary | ICD-10-CM | POA: Diagnosis not present

## 2015-12-20 LAB — CBC WITH DIFFERENTIAL/PLATELET
HEMATOCRIT: 38.6 % (ref 34.0–46.6)
HEMOGLOBIN: 13.1 g/dL (ref 11.1–15.9)
LYMPHS ABS: 1.1 10*3/uL (ref 0.7–3.1)
LYMPHS: 20 %
MCH: 30.1 pg (ref 26.6–33.0)
MCHC: 33.9 g/dL (ref 31.5–35.7)
MCV: 89 fL (ref 79–97)
MID (Absolute): 0.5 10*3/uL (ref 0.1–1.6)
MID: 9 %
NEUTROS ABS: 4.1 10*3/uL (ref 1.4–7.0)
NEUTROS PCT: 72 %
Platelets: 211 10*3/uL (ref 150–379)
RBC: 4.35 x10E6/uL (ref 3.77–5.28)
RDW: 13.4 % (ref 12.3–15.4)
WBC: 5.7 10*3/uL (ref 3.4–10.8)

## 2015-12-20 MED ORDER — MIRTAZAPINE 15 MG PO TABS: 15.0000 mg | ORAL_TABLET | Freq: Every day | ORAL | 1 refills | Status: DC

## 2015-12-20 MED ORDER — ROSUVASTATIN CALCIUM 40 MG PO TABS: 40.0000 mg | ORAL_TABLET | Freq: Every day | ORAL | 1 refills | Status: DC

## 2015-12-20 NOTE — Patient Instructions (Signed)
Please do call to schedule your mammogram; the number to schedule one at either [facility] or [facility] is [phone]   

## 2015-12-20 NOTE — Assessment & Plan Note (Signed)
This is resolved

## 2015-12-20 NOTE — Progress Notes (Signed)
BP 108/65 (BP Location: Left Arm, Patient Position: Sitting, Cuff Size: Small)   Pulse 68   Temp 98.1 F (36.7 C)   Wt 98 lb (44.5 kg)   LMP  (LMP Unknown)   SpO2 100%   BMI 18.04 kg/m    Subjective:    Patient ID: Christine Burch, female    DOB: Mar 10, 1940, 75 y.o.   MRN: [id number]  HPI: Christine Burch is a 75 y.o. female  Chief Complaint  Patient presents with  . Anemia    6 week f/up   Anemia Not taking iron at this time  Memory No real change  Depression/anxiety Not depressed but she is anxious.  She is eating and sleeping better.    Hypercholesterol Zetia becoming a problem for cost.  Cardiology notes reviewed.    Relevant past medical, surgical, family and social history reviewed and updated as indicated. Interim medical history since our last visit reviewed. Allergies and medications reviewed and updated.  Review of Systems  Per HPI unless specifically indicated above     Objective:    BP 108/65 (BP Location: Left Arm, Patient Position: Sitting, Cuff Size: Small)   Pulse 68   Temp 98.1 F (36.7 C)   Wt 98 lb (44.5 kg)   LMP  (LMP Unknown)   SpO2 100%   BMI 18.04 kg/m   Wt Readings from Last 3 Encounters:  12/20/15 98 lb (44.5 kg)  11/08/15 98 lb 9.6 oz (44.7 kg)  07/30/15 97 lb (44 kg)    Physical Exam  Constitutional: She is oriented to person, place, and time. She appears well-developed and well-nourished. No distress.  HENT:  Head: Normocephalic and atraumatic.  Eyes: Conjunctivae and lids are normal. Right eye exhibits no discharge. Left eye exhibits no discharge. No scleral icterus.  Cardiovascular: Normal rate.   Pulmonary/Chest: Effort normal.  Abdominal: Normal appearance. There is no splenomegaly or hepatomegaly.  Musculoskeletal: Normal range of motion.  Neurological: She is alert and oriented to person, place, and time.  Skin: Skin is intact. No rash noted. No pallor.  Psychiatric: She has a normal mood and affect. Her behavior is  normal. Judgment and thought content normal.   H/H 13.1/38.6  Results for orders placed or performed in visit on 11/08/15  CBC With Differential/Platelet  Result Value Ref Range   WBC 5.7 3.4 - 10.8 x10E3/uL   RBC 4.44 3.77 - 5.28 x10E6/uL   Hemoglobin 12.9 11.1 - 15.9 g/dL   Hematocrit 39.0 34.0 - 46.6 %   MCV 88 79 - 97 fL   MCH 29.1 26.6 - 33.0 pg   MCHC 33.1 31.5 - 35.7 g/dL   RDW 15.4 12.3 - 15.4 %   Platelets 212 150 - 379 x10E3/uL   Neutrophils 71 Not Estab. %   Lymphs 18 Not Estab. %   MID 10 Not Estab. %   Neutrophils Absolute 4.1 1.4 - 7.0 x10E3/uL   Lymphocytes Absolute 1.0 0.7 - 3.1 x10E3/uL   MID (Absolute) 0.6 0.1 - 1.6 X10E3/uL  Iron and TIBC  Result Value Ref Range   Total Iron Binding Capacity 269 250 - 450 ug/dL   UIBC 206 118 - 369 ug/dL   Iron 63 27 - 139 ug/dL   Iron Saturation 23 15 - 55 %  Ferritin  Result Value Ref Range   Ferritin 160 (H) 15 - 150 ng/mL      Assessment & Plan:   Problem List Items Addressed This Visit  Unprioritized   Chronic anxiety    Stable      Relevant Medications   mirtazapine (REMERON) 15 MG tablet   High cholesterol    Stop Zetia as in doughnut hole.  Will recheck cholesterol in 3 moths      Relevant Medications   rosuvastatin (CRESTOR) 40 MG tablet   RESOLVED: Iron deficiency anemia - Primary    This is resolved      Relevant Orders   CBC With Differential/Platelet    Other Visit Diagnoses    Breast cancer screening       Relevant Orders   MM DIGITAL SCREENING BILATERAL       Follow up plan: Return in about 3 months (around 03/21/2016).

## 2015-12-20 NOTE — Assessment & Plan Note (Signed)
Stop Zetia as in doughnut hole.  Will recheck cholesterol in 3 moths

## 2015-12-20 NOTE — Assessment & Plan Note (Signed)
Stable

## 2016-01-06 ENCOUNTER — Other Ambulatory Visit: Payer: Self-pay

## 2016-01-06 DIAGNOSIS — D214 Benign neoplasm of connective and other soft tissue of abdomen: Secondary | ICD-10-CM

## 2016-01-24 DIAGNOSIS — R413 Other amnesia: Secondary | ICD-10-CM

## 2016-01-24 HISTORY — DX: Other amnesia: R41.3

## 2016-01-26 ENCOUNTER — Ambulatory Visit
Admission: RE | Admit: 2016-01-26 | Discharge: 2016-01-26 | Disposition: A | Discharge status: Home / Self Care | Payer: Medicare Other | Source: Ambulatory Visit | Attending: Unknown Physician Specialty | Admitting: Unknown Physician Specialty

## 2016-01-26 ENCOUNTER — Other Ambulatory Visit: Payer: Self-pay | Admitting: Unknown Physician Specialty

## 2016-01-26 DIAGNOSIS — Z1231 Encounter for screening mammogram for malignant neoplasm of breast: Secondary | ICD-10-CM | POA: Diagnosis not present

## 2016-01-26 DIAGNOSIS — Z1239 Encounter for other screening for malignant neoplasm of breast: Secondary | ICD-10-CM

## 2016-01-28 ENCOUNTER — Other Ambulatory Visit: Payer: Self-pay | Admitting: *Deleted

## 2016-01-28 ENCOUNTER — Inpatient Hospital Stay
Admission: RE | Admit: 2016-01-28 | Discharge: 2016-01-28 | Disposition: A | Discharge status: Home / Self Care | Payer: Self-pay | Source: Ambulatory Visit | Attending: *Deleted | Admitting: *Deleted

## 2016-01-28 DIAGNOSIS — Z9289 Personal history of other medical treatment: Secondary | ICD-10-CM

## 2016-03-02 ENCOUNTER — Ambulatory Visit
Admission: RE | Admit: 2016-03-02 | Discharge: 2016-03-02 | Disposition: A | Discharge status: Home / Self Care | Payer: Medicare Other | Source: Ambulatory Visit | Attending: General Surgery | Admitting: General Surgery

## 2016-03-02 DIAGNOSIS — I700 Atherosclerosis of aorta: Secondary | ICD-10-CM | POA: Diagnosis not present

## 2016-03-02 DIAGNOSIS — C49A0 Gastrointestinal stromal tumor, unspecified site: Secondary | ICD-10-CM | POA: Diagnosis not present

## 2016-03-02 DIAGNOSIS — D214 Benign neoplasm of connective and other soft tissue of abdomen: Secondary | ICD-10-CM | POA: Insufficient documentation

## 2016-03-02 MED ORDER — IOPAMIDOL (ISOVUE-300) INJECTION 61%
75.0000 mL | Freq: Once | INTRAVENOUS | Status: AC | PRN
  Administered 2016-03-02: 75 mL via INTRAVENOUS

## 2016-03-07 ENCOUNTER — Ambulatory Visit (INDEPENDENT_AMBULATORY_CARE_PROVIDER_SITE_OTHER): Payer: Medicare Other | Admitting: General Surgery

## 2016-03-07 ENCOUNTER — Ambulatory Visit: Payer: Self-pay

## 2016-03-07 ENCOUNTER — Encounter: Payer: Self-pay | Admitting: General Surgery

## 2016-03-07 VITALS — BP 110/68 | HR 76 | Resp 14 | Ht 62.0 in | Wt 100.0 lb

## 2016-03-07 DIAGNOSIS — C49A2 Gastrointestinal stromal tumor of stomach: Secondary | ICD-10-CM

## 2016-03-07 DIAGNOSIS — I6522 Occlusion and stenosis of left carotid artery: Secondary | ICD-10-CM

## 2016-03-07 MED ORDER — POLYETHYLENE GLYCOL 3350 17 GM/SCOOP PO POWD: 1.0000 | Freq: Once | ORAL | 0 refills | Status: AC

## 2016-03-07 NOTE — Progress Notes (Signed)
Patient ID: Christine Burch, female   DOB: 04/08/1940, 76 y.o.   MRN: [id number]  Chief Complaint  Patient presents with  . Follow-up    carotid doppler and CT    HPI Christine Burch is a 76 y.o. female.  Follow up carotid doppler and abdominal CT scan on 03-02-16 for colo polyp and GIST. Denies an trouble swallowing, dizziness or lightheadedness. She is post right colectomy, gallbladder removal and gastric mass excision done on 04/19/15.  She is here today with her husband, Christine Burch. He states she has mild memory loss that seems to be worsening gradually. I have reviewed the history of present illness with the patient.   HPI  Past Medical History:  Diagnosis Date  . Acid reflux   . Colon polyp 2012, 2014  . 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 Enlargement of lymph nodes 2013  . Gallstones   . High cholesterol   . Hypertension 2013  . Memory changes 2018  . Tumors 2006    Past Surgical History:  Procedure Laterality Date  . ABDOMINAL HYSTERECTOMY  1980  . CARDIAC SURGERY  2010   stint  . CAROTID ENDARTERECTOMY Right 2011  . CHOLECYSTECTOMY N/A 04/19/2015   Procedure: LAPAROSCOPIC CHOLECYSTECTOMY;  Surgeon: [name], MD;  Location: [facility] ORS;  Service: General;  Laterality: N/A;  . COLONOSCOPY  [id number]   Dr.Hashmi  . COLONOSCOPY WITH PROPOFOL N/A 03/31/2015   Procedure: COLONOSCOPY WITH PROPOFOL;  Surgeon: [name], MD;  Location: [facility];  Service: Endoscopy;  Laterality: N/A;  . CORONARY STENT PLACEMENT    . EXCISION MASS ABDOMINAL N/A 04/19/2015   (GIST): EXCISION MASS ABDOMINAL/ EXCISION GASTRIC MASS;  Surgeon: [name], MD;  Location: [facility] OR[facility];  Service: General;  Laterality: N/A;  . LAPAROSCOPIC RIGHT COLECTOMY Right 04/19/2015   Procedure: LAPAROSCOPIC RIGHT COLECTOMY;  Surgeon: [name], MD;  Location: [facility] OR[facility];  Service: General;  Laterality: Right;  . UPPER GI ENDOSCOPY  2012, 2014    Family History   Problem Relation Age of Onset  . Heart disease Mother   . Heart disease Father   . Arthritis Sister   . Heart disease Sister   . Heart disease Maternal Grandmother   . Heart disease Maternal Grandfather   . Heart disease Paternal Grandmother   . Heart disease Paternal Grandfather   . Breast cancer Neg Hx     Social History Social History  Substance Use Topics  . Smoking status: Never Smoker  . Smokeless tobacco: Never Used  . Alcohol use No    No Known Allergies  Current Outpatient Prescriptions  Medication Sig Dispense Refill  . aspirin 81 MG tablet Take by mouth. 2 tablets daily    . Cholecalciferol (VITAMIN D3) 2000 units TABS Take 2,000 Units by mouth daily.    . clopidogrel (PLAVIX) 75 MG tablet Take 75 mg by mouth daily.    Marland Kitchen losartan (COZAAR) 25 MG tablet TAKE ONE TABLET BY MOUTH ONCE DAILY 30 tablet 12  . mirtazapine (REMERON) 15 MG tablet Take 1 tablet (15 mg total) by mouth at bedtime. 90 tablet 1  . ranitidine (ZANTAC) 150 MG tablet Take 150 mg by mouth 2 (two) times daily.     . rosuvastatin (CRESTOR) 40 MG tablet Take 1 tablet (40 mg total) by mouth daily. 90 tablet 1  . vitamin C (ASCORBIC ACID) 500 MG tablet Take 500 mg by mouth 2 (two) times daily.     No current facility-administered  medications for this visit.     Review of Systems Review of Systems  Blood pressure 110/68, pulse 76, resp. rate 14, height 5\' 2"  (1.575 m), weight 100 lb (45.4 kg).  Physical Exam Physical Exam  Constitutional: She is oriented to person, place, and time. She appears well-developed and well-nourished.  HENT:  Mouth/Throat: Oropharynx is clear and moist.  Eyes: Conjunctivae are normal. No scleral icterus.  Neck: Neck supple.  Cardiovascular: Normal rate, regular rhythm and normal heart sounds.   Pulmonary/Chest: Effort normal and breath sounds normal.  Abdominal: Soft. Normal appearance and bowel sounds are normal. There is no tenderness.  Lymphadenopathy:    She has  no cervical adenopathy.  Neurological: She is alert and oriented to person, place, and time.  Skin: Skin is warm and dry.  Psychiatric: Her behavior is normal.    Data Reviewed Carotid doppler- there no plaque on right side. Moderate plaque on left ICA origin, appears more prominent and irregular compared to last study. Estimated 50% stenosis. Prior notes and CT scan. CT no abnormality. 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    Plan    Colonoscopy with possible biopsy/polypectomy prn: Information regarding the procedure, including its potential risks and complications (including but not limited to perforation of the bowel, which may require emergency surgery to repair, and bleeding) was verbally given to the patient. Educational information regarding lower intestinal endoscopy was given to the patient. Written instructions for how to complete the bowel prep using Miralax were provided. The importance of drinking ample fluids to avoid dehydration as a result of the prep emphasized.  Follow up left carotid doppler in 6 mos.     This information has been scribed by Karie Fetch RN, BSN,BC.   Sayda Grable G 03/07/2016, 11:42 AM

## 2016-03-07 NOTE — Patient Instructions (Addendum)
The patient is aware to call back for any questions or concerns.   Colonoscopy, Adult A colonoscopy is an exam to look at the entire large intestine. During the exam, a lubricated, bendable tube is inserted into the anus and then passed into the rectum, colon, and other parts of the large intestine. A colonoscopy is often done as a part of normal colorectal screening or in response to certain symptoms, such as anemia, persistent diarrhea, abdominal pain, and blood in the stool. The exam can help screen for and diagnose medical problems, including:  Tumors.  Polyps.  Inflammation.  Areas of bleeding. Tell a health care provider about:  Any allergies you have.  All medicines you are taking, including vitamins, herbs, eye drops, creams, and over-the-counter medicines.  Any problems you or family members have had with anesthetic medicines.  Any blood disorders you have.  Any surgeries you have had.  Any medical conditions you have.  Any problems you have had passing stool. What are the risks? Generally, this is a safe procedure. However, problems may occur, including:  Bleeding.  A tear in the intestine.  A reaction to medicines given during the exam.  Infection (rare). What happens before the procedure? Eating and drinking restrictions  Follow instructions from your health care provider about eating and drinking, which may include:  A few days before the procedure - follow a low-fiber diet. Avoid nuts, seeds, dried fruit, raw fruits, and vegetables.  1-3 days before the procedure - follow a clear liquid diet. Drink only clear liquids, such as clear broth or bouillon, black coffee or tea, clear juice, clear soft drinks or sports drinks, gelatin desert, and popsicles. Avoid any liquids that contain red or purple dye.  On the day of the procedure - do not eat or drink anything during the 2 hours before the procedure, or within the time period that your health care provider  recommends. Bowel prep  If you were prescribed an oral bowel prep to clean out your colon:  Take it as told by your health care provider. Starting the day before your procedure, you will need to drink a large amount of medicated liquid. The liquid will cause you to have multiple loose stools until your stool is almost clear or light green.  If your skin or anus gets irritated from diarrhea, you may use these to relieve the irritation:  Medicated wipes, such as adult wet wipes with aloe and vitamin E.  A skin soothing-product like petroleum jelly.  If you vomit while drinking the bowel prep, take a break for up to 60 minutes and then begin the bowel prep again. If vomiting continues and you cannot take the bowel prep without vomiting, call your health care provider. General instructions  Ask your health care provider about changing or stopping your regular medicines. This is especially important if you are taking diabetes medicines or blood thinners.  Plan to have someone take you home from the hospital or clinic. What happens during the procedure?  An IV tube may be inserted into one of your veins.  You will be given medicine to help you relax (sedative).  To reduce your risk of infection:  Your health care team will wash or sanitize their hands.  Your anal area will be washed with soap.  You will be asked to lie on your side with your knees bent.  Your health care provider will lubricate a long, thin, flexible tube. The tube will have a camera and a  light on the end.  The tube will be inserted into your anus.  The tube will be gently eased through your rectum and colon.  Air will be delivered into your colon to keep it open. You may feel some pressure or cramping.  The camera will be used to take images during the procedure.  A small tissue sample may be removed from your body to be examined under a microscope (biopsy). If any potential problems are found, the tissue will  be sent to a lab for testing.  If small polyps are found, your health care provider may remove them and have them checked for cancer cells.  The tube that was inserted into your anus will be slowly removed. The procedure may vary among health care providers and hospitals. What happens after the procedure?  Your blood pressure, heart rate, breathing rate, and blood oxygen level will be monitored until the medicines you were given have worn off.  Do not drive for 24 hours after the exam.  You may have a small amount of blood in your stool.  You may pass gas and have mild abdominal cramping or bloating due to the air that was used to inflate your colon during the exam.  It is up to you to get the results of your procedure. Ask your health care provider, or the department performing the procedure, when your results will be ready. This information is not intended to replace advice given to you by your health care provider. Make sure you discuss any questions you have with your health care provider. Document Released: 01/07/2000 Document Revised: 07/30/2015 Document Reviewed: 03/23/2015 Elsevier Interactive Patient Education  2017 [employer].  The patient is scheduled for a Colonoscopy at [facility] on 03/22/16. They are aware to call the day before to get their arrival time. She will stop her Plavix 5 days prior. She will only take her blood pressure medication at 6 am with a sip of water the morning of. She may continue her 81 mg Aspirin. Miralax prescription has been sent into the patient's pharmacy. The patient is aware of date and instructions.

## 2016-03-07 NOTE — Addendum Note (Signed)
Addended by: [name] on: 03/07/2016 01:01 PM   Modules accepted: Orders, SmartSet

## 2016-03-07 NOTE — Addendum Note (Signed)
Addended by: Lesly Rubenstein on: 03/07/2016 11:55 AM   Modules accepted: Orders

## 2016-03-14 DIAGNOSIS — I679 Cerebrovascular disease, unspecified: Secondary | ICD-10-CM | POA: Diagnosis not present

## 2016-03-14 DIAGNOSIS — I10 Essential (primary) hypertension: Secondary | ICD-10-CM | POA: Diagnosis not present

## 2016-03-14 DIAGNOSIS — E7800 Pure hypercholesterolemia, unspecified: Secondary | ICD-10-CM | POA: Diagnosis not present

## 2016-03-14 DIAGNOSIS — Z9889 Other specified postprocedural states: Secondary | ICD-10-CM | POA: Diagnosis not present

## 2016-03-21 ENCOUNTER — Ambulatory Visit: Payer: Medicare Other | Admitting: Unknown Physician Specialty

## 2016-03-22 ENCOUNTER — Encounter
Admission: RE | Disposition: A | Discharge status: Home / Self Care | Payer: Self-pay | Source: Ambulatory Visit | Attending: General Surgery

## 2016-03-22 ENCOUNTER — Ambulatory Visit
Admission: RE | Admit: 2016-03-22 | Discharge: 2016-03-22 | Disposition: A | Discharge status: Home / Self Care | Payer: Medicare Other | Source: Ambulatory Visit | Attending: General Surgery | Admitting: General Surgery

## 2016-03-22 ENCOUNTER — Ambulatory Visit: Payer: Medicare Other | Admitting: Anesthesiology

## 2016-03-22 ENCOUNTER — Encounter: Payer: Self-pay | Admitting: Anesthesiology

## 2016-03-22 DIAGNOSIS — K635 Polyp of colon: Secondary | ICD-10-CM | POA: Diagnosis not present

## 2016-03-22 DIAGNOSIS — K5730 Diverticulosis of large intestine without perforation or abscess without bleeding: Secondary | ICD-10-CM | POA: Insufficient documentation

## 2016-03-22 DIAGNOSIS — I2510 Atherosclerotic heart disease of native coronary artery without angina pectoris: Secondary | ICD-10-CM | POA: Diagnosis not present

## 2016-03-22 DIAGNOSIS — Z9049 Acquired absence of other specified parts of digestive tract: Secondary | ICD-10-CM | POA: Insufficient documentation

## 2016-03-22 DIAGNOSIS — K5790 Diverticulosis of intestine, part unspecified, without perforation or abscess without bleeding: Secondary | ICD-10-CM | POA: Diagnosis not present

## 2016-03-22 DIAGNOSIS — K219 Gastro-esophageal reflux disease without esophagitis: Secondary | ICD-10-CM | POA: Diagnosis not present

## 2016-03-22 DIAGNOSIS — Z86010 Personal history of colonic polyps: Secondary | ICD-10-CM | POA: Insufficient documentation

## 2016-03-22 DIAGNOSIS — I10 Essential (primary) hypertension: Secondary | ICD-10-CM | POA: Insufficient documentation

## 2016-03-22 DIAGNOSIS — Z7982 Long term (current) use of aspirin: Secondary | ICD-10-CM | POA: Insufficient documentation

## 2016-03-22 DIAGNOSIS — D123 Benign neoplasm of transverse colon: Secondary | ICD-10-CM | POA: Diagnosis not present

## 2016-03-22 DIAGNOSIS — I252 Old myocardial infarction: Secondary | ICD-10-CM | POA: Insufficient documentation

## 2016-03-22 DIAGNOSIS — Z980 Intestinal bypass and anastomosis status: Secondary | ICD-10-CM | POA: Diagnosis not present

## 2016-03-22 DIAGNOSIS — E7800 Pure hypercholesterolemia, unspecified: Secondary | ICD-10-CM | POA: Diagnosis not present

## 2016-03-22 DIAGNOSIS — I739 Peripheral vascular disease, unspecified: Secondary | ICD-10-CM | POA: Insufficient documentation

## 2016-03-22 DIAGNOSIS — Z79899 Other long term (current) drug therapy: Secondary | ICD-10-CM | POA: Diagnosis not present

## 2016-03-22 DIAGNOSIS — Z1211 Encounter for screening for malignant neoplasm of colon: Secondary | ICD-10-CM | POA: Insufficient documentation

## 2016-03-22 DIAGNOSIS — Z955 Presence of coronary angioplasty implant and graft: Secondary | ICD-10-CM | POA: Insufficient documentation

## 2016-03-22 HISTORY — PX: COLONOSCOPY WITH PROPOFOL: SHX5780

## 2016-03-22 SURGERY — COLONOSCOPY WITH PROPOFOL
Anesthesia: General

## 2016-03-22 MED ORDER — SODIUM CHLORIDE 0.9 % IV SOLN
INTRAVENOUS | Status: DC
  Administered 2016-03-22: 1000 mL via INTRAVENOUS
  Administered 2016-03-22: 08:00:00 via INTRAVENOUS

## 2016-03-22 MED ORDER — PROPOFOL 500 MG/50ML IV EMUL
INTRAVENOUS | Status: DC | PRN
  Administered 2016-03-22: 140 ug/kg/min via INTRAVENOUS

## 2016-03-22 MED ORDER — LIDOCAINE HCL (PF) 1 % IJ SOLN
2.0000 mL | Freq: Once | INTRAMUSCULAR | Status: AC
  Administered 2016-03-22: 0.3 mL via INTRADERMAL
  Filled 2016-03-22: qty 2

## 2016-03-22 MED ORDER — PROPOFOL 10 MG/ML IV BOLUS
INTRAVENOUS | Status: DC | PRN
  Administered 2016-03-22: 30 mg via INTRAVENOUS
  Administered 2016-03-22: 50 mg via INTRAVENOUS

## 2016-03-22 NOTE — Anesthesia Preprocedure Evaluation (Signed)
Anesthesia Evaluation  Patient identified by MRN, date of birth, ID band Patient awake    Reviewed: Allergy & Precautions, H&P , NPO status , Patient's Chart, lab work & pertinent test results  History of Anesthesia Complications Negative for: history of anesthetic complications  Airway Mallampati: III  TM Distance: <3 FB Neck ROM: limited    Dental  (+) Poor Dentition, Chipped, Missing, Partial Upper   Pulmonary neg pulmonary ROS, neg shortness of breath,    Pulmonary exam normal breath sounds clear to auscultation       Cardiovascular Exercise Tolerance: Good hypertension, (-) angina+ CAD and + Peripheral Vascular Disease  (-) Past MI Normal cardiovascular exam Rhythm:regular Rate:Normal     Neuro/Psych negative neurological ROS  negative psych ROS   GI/Hepatic Neg liver ROS, GERD  Controlled and Medicated,  Endo/Other  negative endocrine ROS  Renal/GU negative Renal ROS  negative genitourinary   Musculoskeletal   Abdominal   Peds  Hematology negative hematology ROS (+)   Anesthesia Other Findings Past Medical History: No date: Acid reflux 2012, 2014: Colon polyp 04/19/2015: Colon polyp     Comment: TUBULAR ADENOMA 2013: Enlargement of lymph nodes No date: Gallstones No date: High cholesterol 2013: Hypertension 2018: Memory changes 2006: Tumors  Past Surgical History: 1980: ABDOMINAL HYSTERECTOMY 2010: CARDIAC SURGERY     Comment: stint 2011: CAROTID ENDARTERECTOMY Right 04/19/2015: CHOLECYSTECTOMY N/A     Comment: Procedure: LAPAROSCOPIC CHOLECYSTECTOMY;                Surgeon: [name], MD;  Location:               [facility] ORS;  Service: General;  Laterality: [id number]: COLONOSCOPY     Comment: Dr.Hashmi 03/31/2015: COLONOSCOPY WITH PROPOFOL N/A     Comment: Procedure: COLONOSCOPY WITH PROPOFOL;                Surgeon: [name], MD;  Location:               [facility]  ENDOSCOPY;  Service: Endoscopy;                Laterality: N/A; No date: CORONARY STENT PLACEMENT 04/19/2015: EXCISION MASS ABDOMINAL N/A     Comment: (GIST): EXCISION MASS ABDOMINAL/ EXCISION               GASTRIC MASS;  Surgeon: [name],               MD;  Location: [facility] ORS;  Service: General;                Laterality: N/A; 04/19/2015: LAPAROSCOPIC RIGHT COLECTOMY Right     Comment: Procedure: LAPAROSCOPIC RIGHT COLECTOMY;                Surgeon: [name], MD;  Location:               [facility] ORS;  Service: General;  Laterality:               Right; 2012, 2014: UPPER GI ENDOSCOPY  BMI    Body Mass Index:  19.20 kg/m      Reproductive/Obstetrics negative OB ROS                             Anesthesia Physical Anesthesia Plan  ASA: III  Anesthesia Plan: General   Post-op Pain Management:    Induction:   Airway Management  Planned:   Additional Equipment:   Intra-op Plan:   Post-operative Plan:   Informed Consent: I have reviewed the patients History and Physical, chart, labs and discussed the procedure including the risks, benefits and alternatives for the proposed anesthesia with the patient or authorized representative who has indicated his/her understanding and acceptance.   Dental Advisory Given  Plan Discussed with: Anesthesiologist, CRNA and Surgeon  Anesthesia Plan Comments:         Anesthesia Quick Evaluation

## 2016-03-22 NOTE — Anesthesia Post-op Follow-up Note (Signed)
Anesthesia QCDR form completed.        

## 2016-03-22 NOTE — Interval H&P Note (Signed)
History and Physical Interval Note:  03/22/2016 8:07 AM  Christine Burch  has presented today for surgery, with the diagnosis of hx colon polyp  The various methods of treatment have been discussed with the patient and family. After consideration of risks, benefits and other options for treatment, the patient has consented to  Procedure(s): COLONOSCOPY WITH PROPOFOL (N/A) as a surgical intervention .  The patient's history has been reviewed, patient examined, no change in status, stable for surgery.  I have reviewed the patient's chart and labs.  Questions were answered to the patient's satisfaction.     Kerrie Timm G

## 2016-03-22 NOTE — Op Note (Signed)
[facility] Patient Name: Christine Burch Procedure Date: 03/22/2016 8:26 AM MRN: [id number] Account #: [id number] Date of Birth: 06-05-1940 Admit Type: Outpatient Age: 76 Room: [facility] ENDO ROOM 1 Gender: Female Note Status: Finalized Procedure:            Colonoscopy Indications:          Surveillance: Personal history of piecemeal removal of                        adenoma on last colonoscopy (less than 1 year ago) Providers:            Seeplaputhur G. Jamal Collin, MD Referring MD:         Kathrine Haddock (Referring MD) Medicines:            General Anesthesia Complications:        No immediate complications. Procedure:            Pre-Anesthesia Assessment:                       - General anesthesia under the supervision of an                        anesthesiologist was determined to be medically                        necessary for this procedure based on review of the                        patient's medical history, medications, and prior                        anesthesia history.                       After obtaining informed consent, the colonoscope was                        passed under direct vision. Throughout the procedure,                        the patient's blood pressure, pulse, and oxygen                        saturations were monitored continuously. The                        Colonoscope was introduced through the anus and                        advanced to the the ileocolonic anastomosis. The                        colonoscopy was performed without difficulty. The                        patient tolerated the procedure well. The quality of                        the bowel preparation was good. Findings:      The perianal and digital rectal examinations were normal.      A few  large-mouthed diverticula were found in the sigmoid colon.      A 5 mm polyp was found in the distal transverse colon. The polyp was       sessile. The polyp was  removed with a hot snare. Resection was complete,       but the polyp tissue was only partially retrieved.      A 3 mm polyp was found in the mid transverse colon. The polyp was       sessile. Biopsies were taken with a cold forceps for histology.      The exam was otherwise without abnormality. Impression:           - Diverticulosis in the sigmoid colon.                       - One 5 mm polyp in the distal transverse colon,                        removed with a hot snare. Complete resection. Partial                        retrieval.                       - One 3 mm polyp in the mid transverse colon. Biopsied.                       - The examination was otherwise normal. Recommendation:       - Discharge patient to home.                       - Resume previous diet.                       - Continue present medications.                       - Await pathology results.                       - Repeat colonoscopy in 3 years for surveillance. Procedure Code(s):    --- Professional ---                       [phone], Colonoscopy, flexible; with removal of tumor(s),                        polyp(s), or other lesion(s) by snare technique                       [id number], 110, Colonoscopy, flexible; with biopsy, single                        or multiple Diagnosis Code(s):    --- Professional ---                       D12.3, Benign neoplasm of transverse colon (hepatic                        flexure or splenic flexure)                       Z09, Encounter  for follow-up examination after                        completed treatment for conditions other than malignant                        neoplasm                       Z86.010, Personal history of colonic polyps                       K57.30, Diverticulosis of large intestine without                        perforation or abscess without bleeding CPT copyright 2016 American Medical Association. All rights reserved. The codes documented in this report are  preliminary and upon coder review may  be revised to meet current compliance requirements. [name], MD 03/22/2016 9:12:43 AM This report has been signed electronically. Number of Addenda: 0 Note Initiated On: 03/22/2016 8:26 AM Scope Withdrawal Time: 0 hours 15 minutes 36 seconds  Total Procedure Duration: 0 hours 32 minutes 18 seconds       [facility]

## 2016-03-22 NOTE — H&P (View-Only) (Signed)
Patient ID: Christine Burch, female   DOB: August 31, 1940, 76 y.o.   MRN: [id number]  Chief Complaint  Patient presents with  . Follow-up    carotid doppler and CT    HPI Christine Burch is a 76 y.o. female.  Follow up carotid doppler and abdominal CT scan on 03-02-16 for colo polyp and GIST. Denies an trouble swallowing, dizziness or lightheadedness. She is post right colectomy, gallbladder removal and gastric mass excision done on 04/19/15.  She is here today with her husband, [name]. He states she has mild memory loss that seems to be worsening gradually. I have reviewed the history of present illness with the patient.   HPI  Past Medical History:  Diagnosis Date  . Acid reflux   . Colon polyp 2012, 2014  . 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 Enlargement of lymph nodes 2013  . Gallstones   . High cholesterol   . Hypertension 2013  . Memory changes 2018  . Tumors 2006    Past Surgical History:  Procedure Laterality Date  . ABDOMINAL HYSTERECTOMY  1980  . CARDIAC SURGERY  2010   stint  . CAROTID ENDARTERECTOMY Right 2011  . CHOLECYSTECTOMY N/A 04/19/2015   Procedure: LAPAROSCOPIC CHOLECYSTECTOMY;  Surgeon: [name], MD;  Location: [facility];  Service: General;  Laterality: N/A;  . COLONOSCOPY  [id number]   Dr.Hashmi  . COLONOSCOPY WITH PROPOFOL N/A 03/31/2015   Procedure: COLONOSCOPY WITH PROPOFOL;  Surgeon: [name], MD;  Location: [facility];  Service: Endoscopy;  Laterality: N/A;  . CORONARY STENT PLACEMENT    . EXCISION MASS ABDOMINAL N/A 04/19/2015   (GIST): EXCISION MASS ABDOMINAL/ EXCISION GASTRIC MASS;  Surgeon: [name], MD;  Location: [facility];  Service: General;  Laterality: N/A;  . LAPAROSCOPIC RIGHT COLECTOMY Right 04/19/2015   Procedure: LAPAROSCOPIC RIGHT COLECTOMY;  Surgeon: [name], MD;  Location: [facility];  Service: General;  Laterality: Right;  . UPPER GI ENDOSCOPY  2012, 2014    Family History   Problem Relation Age of Onset  . Heart disease Mother   . Heart disease Father   . Arthritis Sister   . Heart disease Sister   . Heart disease Maternal Grandmother   . Heart disease Maternal Grandfather   . Heart disease Paternal Grandmother   . Heart disease Paternal Grandfather   . Breast cancer Neg Hx     Social History Social History  Substance Use Topics  . Smoking status: Never Smoker  . Smokeless tobacco: Never Used  . Alcohol use No    No Known Allergies  Current Outpatient Prescriptions  Medication Sig Dispense Refill  . aspirin 81 MG tablet Take by mouth. 2 tablets daily    . Cholecalciferol (VITAMIN D3) 2000 units TABS Take 2,000 Units by mouth daily.    . clopidogrel (PLAVIX) 75 MG tablet Take 75 mg by mouth daily.    Marland Kitchen losartan (COZAAR) 25 MG tablet TAKE ONE TABLET BY MOUTH ONCE DAILY 30 tablet 12  . mirtazapine (REMERON) 15 MG tablet Take 1 tablet (15 mg total) by mouth at bedtime. 90 tablet 1  . ranitidine (ZANTAC) 150 MG tablet Take 150 mg by mouth 2 (two) times daily.     . rosuvastatin (CRESTOR) 40 MG tablet Take 1 tablet (40 mg total) by mouth daily. 90 tablet 1  . vitamin C (ASCORBIC ACID) 500 MG tablet Take 500 mg by mouth 2 (two) times daily.     No current facility-administered  medications for this visit.     Review of Systems Review of Systems  Blood pressure 110/68, pulse 76, resp. rate 14, height 5\' 2"  (1.575 m), weight 100 lb (45.4 kg).  Physical Exam Physical Exam  Constitutional: She is oriented to person, place, and time. She appears well-developed and well-nourished.  HENT:  Mouth/Throat: Oropharynx is clear and moist.  Eyes: Conjunctivae are normal. No scleral icterus.  Neck: Neck supple.  Cardiovascular: Normal rate, regular rhythm and normal heart sounds.   Pulmonary/Chest: Effort normal and breath sounds normal.  Abdominal: Soft. Normal appearance and bowel sounds are normal. There is no tenderness.  Lymphadenopathy:    She has  no cervical adenopathy.  Neurological: She is alert and oriented to person, place, and time.  Skin: Skin is warm and dry.  Psychiatric: Her behavior is normal.    Data Reviewed Carotid doppler- there no plaque on right side. Moderate plaque on left ICA origin, appears more prominent and irregular compared to last study. Estimated 50% stenosis. Prior notes and CT scan. CT no abnormality. 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    Plan    Colonoscopy with possible biopsy/polypectomy prn: Information regarding the procedure, including its potential risks and complications (including but not limited to perforation of the bowel, which may require emergency surgery to repair, and bleeding) was verbally given to the patient. Educational information regarding lower intestinal endoscopy was given to the patient. Written instructions for how to complete the bowel prep using Miralax were provided. The importance of drinking ample fluids to avoid dehydration as a result of the prep emphasized.  Follow up left carotid doppler in 6 mos.     This information has been scribed by Karie Fetch RN, BSN,BC.   SANKAR,SEEPLAPUTHUR G 03/07/2016, 11:42 AM

## 2016-03-22 NOTE — Anesthesia Postprocedure Evaluation (Signed)
Anesthesia Post Note  Patient: Christine Burch  Procedure(s) Performed: Procedure(s) (LRB): COLONOSCOPY WITH PROPOFOL (N/A)  Patient location during evaluation: Endoscopy Anesthesia Type: General Level of consciousness: awake and alert Pain management: pain level controlled Vital Signs Assessment: post-procedure vital signs reviewed and stable Respiratory status: spontaneous breathing, nonlabored ventilation, respiratory function stable and patient connected to nasal cannula oxygen Cardiovascular status: blood pressure returned to baseline and stable Postop Assessment: no signs of nausea or vomiting Anesthetic complications: no     Last Vitals:  Vitals:   03/22/16 0917 03/22/16 0927  BP: 121/60 (!) 117/57  Pulse: 70 65  Resp: 14 16  Temp:      Last Pain:  Vitals:   03/22/16 0907  TempSrc: Tympanic                 Precious Haws Piscitello

## 2016-03-22 NOTE — Brief Op Note (Signed)
Right colon anastamosis documented as Cecum.

## 2016-03-22 NOTE — Transfer of Care (Signed)
Immediate Anesthesia Transfer of Care Note  Patient: Christine Burch  Procedure(s) Performed: Procedure(s): COLONOSCOPY WITH PROPOFOL (N/A)  Patient Location: PACU  Anesthesia Type:General  Level of Consciousness: sedated  Airway & Oxygen Therapy: Patient Spontanous Breathing and Patient connected to nasal cannula oxygen  Post-op Assessment: Report given to RN and Post -op Vital signs reviewed and stable  Post vital signs: Reviewed and stable  Last Vitals:  Vitals:   03/22/16 0756 03/22/16 0907  BP: 114/81 110/63  Pulse: 68 67  Resp: 17 15  Temp: 37.2 C     Last Pain:  Vitals:   03/22/16 0756  TempSrc: Tympanic         Complications: No apparent anesthesia complications

## 2016-03-23 ENCOUNTER — Encounter: Payer: Self-pay | Admitting: General Surgery

## 2016-03-24 LAB — SURGICAL PATHOLOGY

## 2016-03-28 ENCOUNTER — Telehealth: Payer: Self-pay | Admitting: *Deleted

## 2016-03-28 NOTE — Telephone Encounter (Signed)
-----   Message from [name], MD sent at 03/27/2016  8:10 AM EST ----- Inform pt- path is benign.

## 2016-03-28 NOTE — Telephone Encounter (Signed)
Notified patient as instructed, patient pleased. Discussed follow-up appointments, patient agrees Placed in recalls for 3 years

## 2016-04-04 ENCOUNTER — Ambulatory Visit: Payer: Medicare Other | Admitting: Unknown Physician Specialty

## 2016-04-07 ENCOUNTER — Encounter: Payer: Self-pay | Admitting: Unknown Physician Specialty

## 2016-04-07 ENCOUNTER — Ambulatory Visit (INDEPENDENT_AMBULATORY_CARE_PROVIDER_SITE_OTHER): Payer: Medicare Other | Admitting: Unknown Physician Specialty

## 2016-04-07 VITALS — BP 126/74 | HR 98 | Temp 99.1°F | Ht 61.1 in | Wt 99.5 lb

## 2016-04-07 DIAGNOSIS — I6522 Occlusion and stenosis of left carotid artery: Secondary | ICD-10-CM

## 2016-04-07 DIAGNOSIS — Z862 Personal history of diseases of the blood and blood-forming organs and certain disorders involving the immune mechanism: Secondary | ICD-10-CM | POA: Insufficient documentation

## 2016-04-07 DIAGNOSIS — R636 Underweight: Secondary | ICD-10-CM

## 2016-04-07 DIAGNOSIS — E7800 Pure hypercholesterolemia, unspecified: Secondary | ICD-10-CM | POA: Diagnosis not present

## 2016-04-07 DIAGNOSIS — E2839 Other primary ovarian failure: Secondary | ICD-10-CM | POA: Diagnosis not present

## 2016-04-07 NOTE — Assessment & Plan Note (Signed)
Appetite better

## 2016-04-07 NOTE — Progress Notes (Signed)
   BP 126/74 (BP Location: Left Arm, Patient Position: Sitting, Cuff Size: Small)   Pulse 98   Temp 99.1 F (37.3 C)   Ht 5' 1.1" (1.552 m)   Wt 99 lb 8 oz (45.1 kg)   LMP  (LMP Unknown)   SpO2 99%   BMI 18.74 kg/m    Subjective:    Patient ID: Christine Burch, female    DOB: 1940-03-23, 76 y.o.   MRN: [id number]  HPI: Christine Burch is a 76 y.o. female  Chief Complaint  Patient presents with  . Hyperlipidemia    3 month f/up    Hypercholesterol-  Changed from Zetia to Crestor last visit due to cost.  She is tolerating the Crestor well.  Needs labs.    Anemia  Resolved last visit.  Still taking Vitamin C though not iron.  This is bothering her and will stop.    Needs bone density  Relevant past medical, surgical, family and social history reviewed and updated as indicated. Interim medical history since our last visit reviewed. Allergies and medications reviewed and updated.  Review of Systems  Per HPI unless specifically indicated above     Objective:    BP 126/74 (BP Location: Left Arm, Patient Position: Sitting, Cuff Size: Small)   Pulse 98   Temp 99.1 F (37.3 C)   Ht 5' 1.1" (1.552 m)   Wt 99 lb 8 oz (45.1 kg)   LMP  (LMP Unknown)   SpO2 99%   BMI 18.74 kg/m   Wt Readings from Last 3 Encounters:  04/07/16 99 lb 8 oz (45.1 kg)  03/22/16 105 lb (47.6 kg)  03/07/16 100 lb (45.4 kg)    Physical Exam  Constitutional: She is oriented to person, place, and time. She appears well-developed and well-nourished. No distress.  HENT:  Head: Normocephalic and atraumatic.  Eyes: Conjunctivae and lids are normal. Right eye exhibits no discharge. Left eye exhibits no discharge. No scleral icterus.  Neck: Normal range of motion. Neck supple. No JVD present. Carotid bruit is not present.  Cardiovascular: Normal rate, regular rhythm and normal heart sounds.   Pulmonary/Chest: Effort normal and breath sounds normal.  Abdominal: Normal appearance. There is no splenomegaly or  hepatomegaly.  Musculoskeletal: Normal range of motion.  Neurological: She is alert and oriented to person, place, and time.  Skin: Skin is warm, dry and intact. No rash noted. No pallor.  Psychiatric: She has a normal mood and affect. Her behavior is normal. Judgment and thought content normal.   Assessment & Plan:   Problem List Items Addressed This Visit      Unprioritized   High cholesterol   Relevant Orders   Lipid Panel w/o Chol/HDL Ratio   History of anemia   Relevant Orders   CBC with Differential/Platelet   Underweight    Appetite better       Other Visit Diagnoses    Ovarian failure    -  Primary   Relevant Orders   DG Bone Density       Follow up plan: Return in about 3 months (around 07/08/2016) for physical.

## 2016-04-08 LAB — CBC WITH DIFFERENTIAL/PLATELET
BASOS ABS: 0 10*3/uL (ref 0.0–0.2)
Basos: 0 %
EOS (ABSOLUTE): 0 10*3/uL (ref 0.0–0.4)
EOS: 0 %
HEMATOCRIT: 38.1 % (ref 34.0–46.6)
HEMOGLOBIN: 12.6 g/dL (ref 11.1–15.9)
IMMATURE GRANS (ABS): 0 10*3/uL (ref 0.0–0.1)
Immature Granulocytes: 0 %
LYMPHS: 4 %
Lymphocytes Absolute: 0.6 10*3/uL — ABNORMAL LOW (ref 0.7–3.1)
MCH: 28.8 pg (ref 26.6–33.0)
MCHC: 33.1 g/dL (ref 31.5–35.7)
MCV: 87 fL (ref 79–97)
MONOCYTES: 7 %
Monocytes Absolute: 1.1 10*3/uL — ABNORMAL HIGH (ref 0.1–0.9)
NEUTROS ABS: 13.2 10*3/uL — AB (ref 1.4–7.0)
Neutrophils: 89 %
Platelets: 219 10*3/uL (ref 150–379)
RBC: 4.38 x10E6/uL (ref 3.77–5.28)
RDW: 13.6 % (ref 12.3–15.4)
WBC: 15 10*3/uL — AB (ref 3.4–10.8)

## 2016-04-08 LAB — LIPID PANEL W/O CHOL/HDL RATIO
CHOLESTEROL TOTAL: 159 mg/dL (ref 100–199)
HDL: 56 mg/dL (ref >39)
LDL CALC: 72 mg/dL (ref 0–99)
Triglycerides: 153 mg/dL — ABNORMAL HIGH (ref 0–149)
VLDL Cholesterol Cal: 31 mg/dL (ref 5–40)

## 2016-04-12 ENCOUNTER — Telehealth: Payer: Self-pay | Admitting: Unknown Physician Specialty

## 2016-04-12 DIAGNOSIS — D72829 Elevated white blood cell count, unspecified: Secondary | ICD-10-CM

## 2016-04-12 NOTE — Telephone Encounter (Signed)
Can patient just come by for a lab visit? Please enter future orders for the labs you would like done on the patient.

## 2016-04-12 NOTE — Telephone Encounter (Signed)
yes

## 2016-04-12 NOTE — Telephone Encounter (Signed)
Christine Burch (pt's husband) would like to know if Christine Burch needs an appointment to have her WBC rechecked or if she needs to come in for labs only. Please call Christine Burch to advise.

## 2016-04-12 NOTE — Telephone Encounter (Signed)
Called and let patient's husband that they could just come by for a lab visit. Orders in.

## 2016-04-12 NOTE — Progress Notes (Signed)
Normal labs.  Pt notified through mychart.  Planning on coming in for another test

## 2016-04-13 ENCOUNTER — Other Ambulatory Visit: Payer: Medicare Other

## 2016-04-13 DIAGNOSIS — D72829 Elevated white blood cell count, unspecified: Secondary | ICD-10-CM | POA: Diagnosis not present

## 2016-04-13 DIAGNOSIS — N390 Urinary tract infection, site not specified: Secondary | ICD-10-CM | POA: Diagnosis not present

## 2016-04-14 LAB — CBC WITH DIFFERENTIAL/PLATELET
BASOS ABS: 0 10*3/uL (ref 0.0–0.2)
Basos: 0 %
EOS (ABSOLUTE): 0.1 10*3/uL (ref 0.0–0.4)
Eos: 2 %
Hematocrit: 35.4 % (ref 34.0–46.6)
Hemoglobin: 11.5 g/dL (ref 11.1–15.9)
IMMATURE GRANS (ABS): 0 10*3/uL (ref 0.0–0.1)
Immature Granulocytes: 0 %
LYMPHS: 20 %
Lymphocytes Absolute: 1.1 10*3/uL (ref 0.7–3.1)
MCH: 28.5 pg (ref 26.6–33.0)
MCHC: 32.5 g/dL (ref 31.5–35.7)
MCV: 88 fL (ref 79–97)
MONOS ABS: 0.4 10*3/uL (ref 0.1–0.9)
Monocytes: 8 %
NEUTROS PCT: 70 %
Neutrophils Absolute: 3.8 10*3/uL (ref 1.4–7.0)
PLATELETS: 240 10*3/uL (ref 150–379)
RBC: 4.04 x10E6/uL (ref 3.77–5.28)
RDW: 13.6 % (ref 12.3–15.4)
WBC: 5.5 10*3/uL (ref 3.4–10.8)

## 2016-04-19 ENCOUNTER — Telehealth: Payer: Self-pay | Admitting: Unknown Physician Specialty

## 2016-04-19 LAB — UA/M W/RFLX CULTURE, ROUTINE
BILIRUBIN UA: NEGATIVE
Glucose, UA: NEGATIVE
Ketones, UA: NEGATIVE
Nitrite, UA: POSITIVE — AB
PH UA: 6.5 (ref 5.0–7.5)
Specific Gravity, UA: 1.025 (ref 1.005–1.030)
Urobilinogen, Ur: 0.2 mg/dL (ref 0.2–1.0)

## 2016-04-19 LAB — MICROSCOPIC EXAMINATION
RBC, UA: >30 /hpf — AB (ref 0–?)
WBC, UA: >30 /hpf — AB (ref 0–?)

## 2016-04-19 LAB — URINE CULTURE, REFLEX

## 2016-04-19 MED ORDER — CIPROFLOXACIN HCL 250 MG PO TABS: 250.0000 mg | ORAL_TABLET | Freq: Two times a day (BID) | ORAL | 0 refills | Status: DC

## 2016-04-19 NOTE — Telephone Encounter (Signed)
Called and spoke to patient's husband. I let him know what Malachy Mood said. Patient's husband states that he think the patient's UTI is getting worse because the patient has been sleeping a lot and not eating much. I let him know that cipro was at the pharmacy for the patient to start taking and that Malachy Mood said we would change the medication if need be.

## 2016-04-19 NOTE — Telephone Encounter (Signed)
Patients husband called to see if Malachy Mood had the results back on patients test for UTI.  [name] said to please call his cell phone if Malachy Mood tries to reach him.  Thanks

## 2016-04-19 NOTE — Telephone Encounter (Signed)
I am still waiting to get a sensitivity back - to find out the right antibiotic to use.  Whey don't I go ahead and call in Cipro and we will change if we need to

## 2016-04-19 NOTE — Telephone Encounter (Signed)
Routing to provider  

## 2016-04-20 ENCOUNTER — Encounter: Payer: Self-pay | Admitting: Family Medicine

## 2016-04-20 ENCOUNTER — Ambulatory Visit (INDEPENDENT_AMBULATORY_CARE_PROVIDER_SITE_OTHER): Payer: Medicare Other | Admitting: Family Medicine

## 2016-04-20 VITALS — BP 108/64 | HR 98 | Temp 99.4°F | Wt 100.0 lb

## 2016-04-20 DIAGNOSIS — N3001 Acute cystitis with hematuria: Secondary | ICD-10-CM | POA: Diagnosis not present

## 2016-04-20 DIAGNOSIS — R509 Fever, unspecified: Secondary | ICD-10-CM

## 2016-04-20 DIAGNOSIS — I6522 Occlusion and stenosis of left carotid artery: Secondary | ICD-10-CM

## 2016-04-20 LAB — CBC WITH DIFFERENTIAL/PLATELET
HEMOGLOBIN: 11.1 g/dL (ref 11.1–15.9)
Hematocrit: 34.9 % (ref 34.0–46.6)
LYMPHS ABS: 0.6 10*3/uL — AB (ref 0.7–3.1)
Lymphs: 7 %
MCH: 28.2 pg (ref 26.6–33.0)
MCHC: 31.8 g/dL (ref 31.5–35.7)
MCV: 89 fL (ref 79–97)
MID (Absolute): 0.7 10*3/uL (ref 0.1–1.6)
MID: 8 %
NEUTROS ABS: 8.1 10*3/uL — AB (ref 1.4–7.0)
Neutrophils: 86 %
PLATELETS: 210 10*3/uL (ref 150–379)
RBC: 3.93 x10E6/uL (ref 3.77–5.28)
RDW: 13.5 % (ref 12.3–15.4)
WBC: 9.4 10*3/uL (ref 3.4–10.8)

## 2016-04-20 LAB — VERITOR FLU A/B WAIVED
Influenza A: NEGATIVE
Influenza B: NEGATIVE

## 2016-04-20 MED ORDER — CIPROFLOXACIN HCL 500 MG PO TABS: 500.0000 mg | ORAL_TABLET | Freq: Two times a day (BID) | ORAL | 0 refills | Status: DC

## 2016-04-20 NOTE — Patient Instructions (Signed)
Follow up if worsening or no improvement

## 2016-04-20 NOTE — Progress Notes (Signed)
BP 108/64   Pulse 98   Temp 99.4 F (37.4 C)   Wt 100 lb (45.4 kg)   LMP  (LMP Unknown)   SpO2 97%   BMI 18.83 kg/m    Subjective:    Patient ID: Christine Burch, female    DOB: [birth year], 76 y.o.   MRN: [id number]  HPI: Christine Burch is a 76 y.o. female  Chief Complaint  Patient presents with  . Fever    Was 102.5 at lunch time, No burning with urination. No low back pain. Feels ok other than just running a fever.    Patient presents for 2 days of fever and lethargy. Dx of UTI on 3/22, started on cipro yesterday evening and has taken 2 doses now. Fever has been 102.5 since yesterday, comes down some with tylenol. Pt otherwise states she feels fine other than some urinary hesitancy. No N/V/D, abdominal pain, cough, SOB, wounds, body aches. No previous hx of UTIs. Eating and drinking fine. Last dose of tylenol was about 1 pm. Husband concerned by how confused she became last night during the worst part of the fever.   Past Medical History:  Diagnosis Date  . Acid reflux   . Colon polyp 2012, 2014  . 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 Enlargement of lymph nodes 2013  . Gallstones   . High cholesterol   . Hypertension 2013  . Memory changes 2018  . Tumors 2006   Social History   Social History  . Marital status: Married    Spouse name: N/A  . Number of children: N/A  . Years of education: N/A   Occupational History  . Not on file.   Social History Main Topics  . Smoking status: Never Smoker  . Smokeless tobacco: Never Used  . Alcohol use No  . Drug use: No  . Sexual activity: No   Other Topics Concern  . Not on file   Social History Narrative  . No narrative on file    Relevant past medical, surgical, family and social history reviewed and updated as indicated. Interim medical history since our last visit reviewed. Allergies and medications reviewed and updated.  Review of Systems  Constitutional: Positive for diaphoresis and fever.  HENT:  Negative.   Eyes: Negative.   Respiratory: Negative.   Cardiovascular: Negative.   Gastrointestinal: Negative.   Genitourinary: Positive for difficulty urinating.  Musculoskeletal: Negative.   Skin: Negative for rash and wound.  Neurological: Negative.   Psychiatric/Behavioral: Negative.     Per HPI unless specifically indicated above     Objective:    BP 108/64   Pulse 98   Temp 99.4 F (37.4 C)   Wt 100 lb (45.4 kg)   LMP  (LMP Unknown)   SpO2 97%   BMI 18.83 kg/m   Wt Readings from Last 3 Encounters:  04/20/16 100 lb (45.4 kg)  04/07/16 99 lb 8 oz (45.1 kg)  03/22/16 105 lb (47.6 kg)    Physical Exam  Constitutional: She is oriented to person, place, and time. She appears well-developed and well-nourished. No distress.  HENT:  Head: Atraumatic.  Right Ear: External ear normal.  Left Ear: External ear normal.  Nose: Nose normal.  Mouth/Throat: Oropharynx is clear and moist.  Eyes: Conjunctivae are normal. Pupils are equal, round, and reactive to light.  Neck: Normal range of motion. Neck supple.  Cardiovascular: Normal rate and normal heart sounds.   Pulmonary/Chest: Effort normal and breath sounds normal.  No respiratory distress.  Abdominal: Soft. Bowel sounds are normal. There is no tenderness.  Musculoskeletal: Normal range of motion. She exhibits no tenderness (No CVA tenderness).  Neurological: She is alert and oriented to person, place, and time.  Skin: Skin is warm and dry.  Psychiatric: She has a normal mood and affect. Her behavior is normal.  Nursing note and vitals reviewed.   Results for orders placed or performed in visit on 04/20/16  Influenza A & B (STAT)  Result Value Ref Range   Influenza A Negative Negative   Influenza B Negative Negative  CBC With Differential/Platelet  Result Value Ref Range   WBC 9.4 3.4 - 10.8 x10E3/uL   RBC 3.93 3.77 - 5.28 x10E6/uL   Hemoglobin 11.1 11.1 - 15.9 g/dL   Hematocrit 34.9 34.0 - 46.6 %   MCV 89 79 -  97 fL   MCH 28.2 26.6 - 33.0 pg   MCHC 31.8 31.5 - 35.7 g/dL   RDW 13.5 12.3 - 15.4 %   Platelets 210 150 - 379 x10E3/uL   Neutrophils 86 Not Estab. %   Lymphs 7 Not Estab. %   MID 8 Not Estab. %   Neutrophils Absolute 8.1 (H) 1.4 - 7.0 x10E3/uL   Lymphocytes Absolute 0.6 (L) 0.7 - 3.1 x10E3/uL   MID (Absolute) 0.7 0.1 - 1.6 X10E3/uL      Assessment & Plan:   Problem List Items Addressed This Visit    None    Visit Diagnoses    Acute cystitis with hematuria    -  Primary   Will increase cipro, start 7 day course. Push fluids, tylenol for fever control. F/u if worsening or no improvement.    Relevant Orders   UA/M w/rflx Culture, Routine (STAT)   Fever, unspecified fever cause       Rapid flu neg, CBC WNL. Suspect d/t worsening UTI. Will treat as above. Continue ibuprofen and tylenol for fever control. Rest, push fluids   Relevant Orders   Influenza A & B (STAT) (Completed)   CBC With Differential/Platelet (Completed)    Pt unable to leave specimen today, but previous U/A from 3/22 reviewed -  culture showing susceptibility to the cipro.  Strict return precautions given, especially for persistent, uncontrolled fevers or inability to keep fluids down.   Follow up plan: Return for as scheduled.

## 2016-07-06 ENCOUNTER — Ambulatory Visit (INDEPENDENT_AMBULATORY_CARE_PROVIDER_SITE_OTHER): Payer: Medicare Other

## 2016-07-06 VITALS — BP 108/58 | HR 60 | Temp 98.3°F | Resp 16 | Ht 61.5 in | Wt 97.6 lb

## 2016-07-06 DIAGNOSIS — Z0000 Encounter for general adult medical examination without abnormal findings: Secondary | ICD-10-CM

## 2016-07-06 NOTE — Progress Notes (Signed)
Subjective:   Christine Burch is a 76 y.o. female who presents for Medicare Annual (Subsequent) preventive examination.  Review of Systems:   Cardiac Risk Factors include: hypertension;dyslipidemia;advanced age (>9men, >3 women)     Objective:     Vitals: BP (!) 108/58 (BP Location: Left Arm, Patient Position: Sitting)   Pulse 60   Temp 98.3 F (36.8 C)   Resp 16   Ht 5' 1.5" (1.562 m)   Wt 97 lb 9.6 oz (44.3 kg)   LMP  (LMP Unknown)   BMI 18.14 kg/m   Body mass index is 18.14 kg/m.   Tobacco History  Smoking Status  . Never Smoker  Smokeless Tobacco  . Never Used     Counseling given: Not Answered   Past Medical History:  Diagnosis Date  . Acid reflux   . Colon polyp 2012, 2014  . 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 Enlargement of lymph nodes 2013  . Gallstones   . High cholesterol   . Hypertension 2013  . Memory changes 2018  . Tumors 2006   Past Surgical History:  Procedure Laterality Date  . ABDOMINAL HYSTERECTOMY  1980  . CARDIAC SURGERY  2010   stint  . CAROTID ENDARTERECTOMY Right 2011  . CHOLECYSTECTOMY N/A 04/19/2015   Procedure: LAPAROSCOPIC CHOLECYSTECTOMY;  Surgeon: [name], MD;  Location: [facility];  Service: General;  Laterality: N/A;  . COLONOSCOPY  [id number]   Dr.Hashmi  . COLONOSCOPY WITH PROPOFOL N/A 03/31/2015   Procedure: COLONOSCOPY WITH PROPOFOL;  Surgeon: [name], MD;  Location: [facility];  Service: Endoscopy;  Laterality: N/A;  . COLONOSCOPY WITH PROPOFOL N/A 03/22/2016   Procedure: COLONOSCOPY WITH PROPOFOL;  Surgeon: [name], MD;  Location: [facility];  Service: Endoscopy;  Laterality: N/A;  . CORONARY STENT PLACEMENT    . EXCISION MASS ABDOMINAL N/A 04/19/2015   (GIST): EXCISION MASS ABDOMINAL/ EXCISION GASTRIC MASS;  Surgeon: [name], MD;  Location: [facility];  Service: General;  Laterality: N/A;  . LAPAROSCOPIC RIGHT COLECTOMY Right 04/19/2015   Procedure:  LAPAROSCOPIC RIGHT COLECTOMY;  Surgeon: [name], MD;  Location: [facility];  Service: General;  Laterality: Right;  . UPPER GI ENDOSCOPY  2012, 2014   Family History  Problem Relation Age of Onset  . Heart disease Mother   . Heart disease Father   . Arthritis Sister   . Heart disease Sister   . Heart disease Maternal Grandmother   . Heart disease Maternal Grandfather   . Heart disease Paternal Grandmother   . Heart disease Paternal Grandfather   . Breast cancer Neg Hx    History  Sexual Activity  . Sexual activity: No    Outpatient Encounter Prescriptions as of 07/06/2016  Medication Sig  . aspirin 81 MG tablet Take by mouth. 2 tablets daily  . clopidogrel (PLAVIX) 75 MG tablet Take 75 mg by mouth daily.  Marland Kitchen losartan (COZAAR) 25 MG tablet TAKE ONE TABLET BY MOUTH ONCE DAILY  . mirtazapine (REMERON) 15 MG tablet Take 1 tablet (15 mg total) by mouth at bedtime.  . ranitidine (ZANTAC) 150 MG tablet Take 150 mg by mouth 2 (two) times daily.   . rosuvastatin (CRESTOR) 40 MG tablet Take 1 tablet (40 mg total) by mouth daily.  . Cholecalciferol (VITAMIN D3) 2000 units TABS Take 2,000 Units by mouth daily.  . [DISCONTINUED] ciprofloxacin (CIPRO) 500 MG tablet Take 1 tablet (500 mg total) by mouth 2 (two) times daily. (  Patient not taking: Reported on 07/06/2016)  . [DISCONTINUED] vitamin C (ASCORBIC ACID) 500 MG tablet Take 500 mg by mouth 2 (two) times daily.   No facility-administered encounter medications on file as of 07/06/2016.     Activities of Daily Living In your present state of health, do you have any difficulty performing the following activities: 07/06/2016  Hearing? N  Vision? N  Difficulty concentrating or making decisions? N  Walking or climbing stairs? N  Dressing or bathing? N  Doing errands, shopping? N  Preparing Food and eating ? N  Using the Toilet? N  In the past six months, have you accidently leaked urine? N  Do you have problems with loss of bowel  control? N  Managing your Medications? N  Managing your Finances? N  Housekeeping or managing your Housekeeping? N  Some recent data might be hidden    Patient Care Team: Kathrine Haddock, NP as PCP - General (Nurse Practitioner) [name], MD (General Surgery) Laneta Simmers as Physician Assistant (Urology) Isaias Cowman, MD as Consulting Physician (Cardiology) Thelma Comp, OD (Optometry)    Assessment:     Exercise Activities and Dietary recommendations Current Exercise Habits: The patient does not participate in regular exercise at present, Exercise limited by: None identified  Goals    . Have 3 meals a day          Recommend eating 3 healthy meals a day       Fall Risk Fall Risk  11/08/2015 10/27/2014  Falls in the past year? No No   Depression Screen PHQ 2/9 Scores 07/06/2016 11/08/2015 06/30/2015 10/27/2014  PHQ - 2 Score 0 0 2 0  PHQ- 9 Score - 0 4 -     Cognitive Function     6CIT Screen 07/06/2016  What Year? 0 points  What month? 0 points  What time? 0 points  Count back from 20 0 points  Months in reverse 4 points  Repeat phrase 10 points  Total Score 14     There is no immunization history on file for this patient. Screening Tests Health Maintenance  Topic Date Due  . TETANUS/TDAP  11/07/2016 (Originally 10/26/2015)  . PNA vac Low Risk Adult (1 of 2 - PCV13) 11/07/2016 (Originally 07/28/2007)  . INFLUENZA VACCINE  08/23/2016  . MAMMOGRAM  01/25/2018  . COLONOSCOPY  03/22/2026  . DEXA SCAN  Completed      Plan:  I have personally reviewed and addressed the Medicare Annual Wellness questionnaire and have noted the following in the patient's chart:  A. Medical and social history B. Use of alcohol, tobacco or illicit drugs  C. Current medications and supplements D. Functional ability and status E.  Nutritional status F.  Physical activity G. Advance directives H. List of other physicians I.  Hospitalizations,  surgeries, and ER visits in previous 12 months J.  [place] such as hearing and vision if needed, cognitive and depression L. Referrals and appointments   In addition, I have reviewed and discussed with patient certain preventive protocols, quality metrics, and best practice recommendations. A written personalized care plan for preventive services as well as general preventive health recommendations were provided to patient.   Signed,  Tyler Aas, LPN Nurse Health Advisor   MD Recommendations: Patient underweight, discussed option for nutrition counselor-declined.  Patients memory has declined (stated from husband)- scored 14 on 6cit today.

## 2016-07-06 NOTE — Patient Instructions (Addendum)
Christine Burch , Thank you for taking time to come for your Medicare Wellness Visit. I appreciate your ongoing commitment to your health goals. Please review the following plan we discussed and let me know if I can assist you in the future.   Screening recommendations/referrals: Colonoscopy: completed 03/22/2016 Mammogram: completed 01/28/2016 Bone Density: completed 10/13/2009  Recommended yearly ophthalmology/optometry visit for glaucoma screening and checkup Recommended yearly dental visit for hygiene and checkup  Vaccinations: Influenza vaccine: due 09/2016- declined  Pneumococcal vaccine: prevnar 13 due- declined  Tdap vaccine: up to date- declined  Shingles vaccine: due, check with your insurance company for coverage    Advanced directives: Please bring a copy at your convenience.   Conditions/risks identified: Recommend eating 3 healthy meals a day   Next appointment: Follow up on 07/17/2016 at 10:00am with Kathrine Haddock. Follow up in one year for your annual wellness exam.   Preventive Care 65 Years and Older, Female Preventive care refers to lifestyle choices and visits with your health care provider that can promote health and wellness. What does preventive care include?  A yearly physical exam. This is also called an annual well check.  Dental exams once or twice a year.  Routine eye exams. Ask your health care provider how often you should have your eyes checked.  Personal lifestyle choices, including:  Daily care of your teeth and gums.  Regular physical activity.  Eating a healthy diet.  Avoiding tobacco and drug use.  Limiting alcohol use.  Practicing safe sex.  Taking low-dose aspirin every day.  Taking vitamin and mineral supplements as recommended by your health care provider. What happens during an annual well check? The services and screenings done by your health care provider during your annual well check will depend on your age, overall health,  lifestyle risk factors, and family history of disease. Counseling  Your health care provider may ask you questions about your:  Alcohol use.  Tobacco use.  Drug use.  Emotional well-being.  Home and relationship well-being.  Sexual activity.  Eating habits.  History of falls.  Memory and ability to understand (cognition).  Work and work Statistician.  Reproductive health. Screening  You may have the following tests or measurements:  Height, weight, and BMI.  Blood pressure.  Lipid and cholesterol levels. These may be checked every 5 years, or more frequently if you are over 74 years old.  Skin check.  Lung cancer screening. You may have this screening every year starting at age 54 if you have a 30-pack-year history of smoking and currently smoke or have quit within the past 15 years.  Fecal occult blood test (FOBT) of the stool. You may have this test every year starting at age 29.  Flexible sigmoidoscopy or colonoscopy. You may have a sigmoidoscopy every 5 years or a colonoscopy every 10 years starting at age 30.  Hepatitis C blood test.  Hepatitis B blood test.  Sexually transmitted disease (STD) testing.  Diabetes screening. This is done by checking your blood sugar (glucose) after you have not eaten for a while (fasting). You may have this done every 1-3 years.  Bone density scan. This is done to screen for osteoporosis. You may have this done starting at age 30.  Mammogram. This may be done every 1-2 years. Talk to your health care provider about how often you should have regular mammograms. Talk with your health care provider about your test results, treatment options, and if necessary, the need for more tests. Vaccines  Your health care provider may recommend certain vaccines, such as:  Influenza vaccine. This is recommended every year.  Tetanus, diphtheria, and acellular pertussis (Tdap, Td) vaccine. You may need a Td booster every 10 years.  Zoster  vaccine. You may need this after age 66.  Pneumococcal 13-valent conjugate (PCV13) vaccine. One dose is recommended after age 21.  Pneumococcal polysaccharide (PPSV23) vaccine. One dose is recommended after age 70. Talk to your health care provider about which screenings and vaccines you need and how often you need them. This information is not intended to replace advice given to you by your health care provider. Make sure you discuss any questions you have with your health care provider. Document Released: 02/05/2015 Document Revised: 09/29/2015 Document Reviewed: 11/10/2014 Elsevier Interactive Patient Education  2017 [employer] Prevention in the Home Falls can cause injuries. They can happen to people of all ages. There are many things you can do to make your home safe and to help prevent falls. What can I do on the outside of my home?  Regularly fix the edges of walkways and driveways and fix any cracks.  Remove anything that might make you trip as you walk through a door, such as a raised step or threshold.  Trim any bushes or trees on the path to your home.  Use bright outdoor lighting.  Clear any walking paths of anything that might make someone trip, such as rocks or tools.  Regularly check to see if handrails are loose or broken. Make sure that both sides of any steps have handrails.  Any raised decks and porches should have guardrails on the edges.  Have any leaves, snow, or ice cleared regularly.  Use sand or salt on walking paths during winter.  Clean up any spills in your garage right away. This includes oil or grease spills. What can I do in the bathroom?  Use night lights.  Install grab bars by the toilet and in the tub and shower. Do not use towel bars as grab bars.  Use non-skid mats or decals in the tub or shower.  If you need to sit down in the shower, use a plastic, non-slip stool.  Keep the floor dry. Clean up any water that spills on the  floor as soon as it happens.  Remove soap buildup in the tub or shower regularly.  Attach bath mats securely with double-sided non-slip rug tape.  Do not have throw rugs and other things on the floor that can make you trip. What can I do in the bedroom?  Use night lights.  Make sure that you have a light by your bed that is easy to reach.  Do not use any sheets or blankets that are too big for your bed. They should not hang down onto the floor.  Have a firm chair that has side arms. You can use this for support while you get dressed.  Do not have throw rugs and other things on the floor that can make you trip. What can I do in the kitchen?  Clean up any spills right away.  Avoid walking on wet floors.  Keep items that you use a lot in easy-to-reach places.  If you need to reach something above you, use a strong step stool that has a grab bar.  Keep electrical cords out of the way.  Do not use floor polish or wax that makes floors slippery. If you must use wax, use non-skid floor wax.  Do  not have throw rugs and other things on the floor that can make you trip. What can I do with my stairs?  Do not leave any items on the stairs.  Make sure that there are handrails on both sides of the stairs and use them. Fix handrails that are broken or loose. Make sure that handrails are as long as the stairways.  Check any carpeting to make sure that it is firmly attached to the stairs. Fix any carpet that is loose or worn.  Avoid having throw rugs at the top or bottom of the stairs. If you do have throw rugs, attach them to the floor with carpet tape.  Make sure that you have a light switch at the top of the stairs and the bottom of the stairs. If you do not have them, ask someone to add them for you. What else can I do to help prevent falls?  Wear shoes that:  Do not have high heels.  Have rubber bottoms.  Are comfortable and fit you well.  Are closed at the toe. Do not wear  sandals.  If you use a stepladder:  Make sure that it is fully opened. Do not climb a closed stepladder.  Make sure that both sides of the stepladder are locked into place.  Ask someone to hold it for you, if possible.  Clearly mark and make sure that you can see:  Any grab bars or handrails.  First and last steps.  Where the edge of each step is.  Use tools that help you move around (mobility aids) if they are needed. These include:  Canes.  Walkers.  Scooters.  Crutches.  Turn on the lights when you go into a dark area. Replace any light bulbs as soon as they burn out.  Set up your furniture so you have a clear path. Avoid moving your furniture around.  If any of your floors are uneven, fix them.  If there are any pets around you, be aware of where they are.  Review your medicines with your doctor. Some medicines can make you feel dizzy. This can increase your chance of falling. Ask your doctor what other things that you can do to help prevent falls. This information is not intended to replace advice given to you by your health care provider. Make sure you discuss any questions you have with your health care provider. Document Released: 11/05/2008 Document Revised: 06/17/2015 Document Reviewed: 02/13/2014 Elsevier Interactive Patient Education  2017 [employer].

## 2016-07-11 ENCOUNTER — Telehealth: Payer: Self-pay | Admitting: Unknown Physician Specialty

## 2016-07-11 NOTE — Telephone Encounter (Signed)
Routing to provider for advice.

## 2016-07-11 NOTE — Telephone Encounter (Signed)
Only if she has acid reflux

## 2016-07-11 NOTE — Telephone Encounter (Signed)
Patients husband called to see if [name] should still be taking the Zantac.     Please advise.  Thanks

## 2016-07-12 NOTE — Telephone Encounter (Signed)
Tried calling home number, phone kept ringing and no VM came up. Tried calling mobile number, left VM asking for patient's husband to please return my call.

## 2016-07-12 NOTE — Telephone Encounter (Signed)
Called and spoke to patient's husband. He states that he got the medication zantac mixed up with zetia. He states that the zetia was discontinued and that everything is good now.

## 2016-07-17 ENCOUNTER — Encounter: Payer: Self-pay | Admitting: Unknown Physician Specialty

## 2016-07-17 ENCOUNTER — Ambulatory Visit (INDEPENDENT_AMBULATORY_CARE_PROVIDER_SITE_OTHER): Payer: Medicare Other | Admitting: Unknown Physician Specialty

## 2016-07-17 VITALS — BP 120/70 | HR 69 | Temp 98.1°F | Wt 98.1 lb

## 2016-07-17 DIAGNOSIS — Z7189 Other specified counseling: Secondary | ICD-10-CM | POA: Insufficient documentation

## 2016-07-17 DIAGNOSIS — I6522 Occlusion and stenosis of left carotid artery: Secondary | ICD-10-CM | POA: Diagnosis not present

## 2016-07-17 DIAGNOSIS — Z87440 Personal history of urinary (tract) infections: Secondary | ICD-10-CM | POA: Diagnosis not present

## 2016-07-17 DIAGNOSIS — I10 Essential (primary) hypertension: Secondary | ICD-10-CM

## 2016-07-17 DIAGNOSIS — I2510 Atherosclerotic heart disease of native coronary artery without angina pectoris: Secondary | ICD-10-CM | POA: Diagnosis not present

## 2016-07-17 DIAGNOSIS — N182 Chronic kidney disease, stage 2 (mild): Secondary | ICD-10-CM

## 2016-07-17 DIAGNOSIS — F0390 Unspecified dementia without behavioral disturbance: Secondary | ICD-10-CM

## 2016-07-17 DIAGNOSIS — Z0000 Encounter for general adult medical examination without abnormal findings: Secondary | ICD-10-CM

## 2016-07-17 DIAGNOSIS — N189 Chronic kidney disease, unspecified: Secondary | ICD-10-CM | POA: Insufficient documentation

## 2016-07-17 LAB — MICROSCOPIC EXAMINATION

## 2016-07-17 LAB — UA/M W/RFLX CULTURE, ROUTINE
BILIRUBIN UA: NEGATIVE
Glucose, UA: NEGATIVE
Ketones, UA: NEGATIVE
LEUKOCYTES UA: NEGATIVE
Nitrite, UA: NEGATIVE
PH UA: 6.5 (ref 5.0–7.5)
SPEC GRAV UA: 1.025 (ref 1.005–1.030)
Urobilinogen, Ur: 0.2 mg/dL (ref 0.2–1.0)

## 2016-07-17 NOTE — Patient Instructions (Addendum)
36 hour day End Alzheimer's Mind diet  [facility] Phone: [phone]

## 2016-07-17 NOTE — Assessment & Plan Note (Addendum)
Mini mental 24/30.  Add Vitamin D 2,0000 IUs/day. Recommended not driving.   Probably vascular

## 2016-07-17 NOTE — Assessment & Plan Note (Signed)
Continue current meds 

## 2016-07-17 NOTE — Progress Notes (Addendum)
BP 120/70   Pulse 69   Temp 98.1 F (36.7 C)   Wt 98 lb 1.6 oz (44.5 kg)   LMP  (LMP Unknown)   SpO2 98%   BMI 18.24 kg/m    Subjective:    Patient ID: Christine Burch, female    DOB: 1940-12-05, 76 y.o.   MRN: [id number]  HPI: Christine Burch is a 76 y.o. female  Chief Complaint  Patient presents with  . Annual Exam    pt seen by [facility] on 07/06/16 for wellness   CAD Chart reviewed.  Mild RCA and seeing Dr Josefa Half  Carotid artery disease Pt has a left circumflex RCA.  Due to see Dr Josefa Half.  That not was reviewed.    Dementia Pt is here with her mom partly to discuss her dementia progression.  Noted major differences in the last year.  She is very disoriented in the AM.  She sometimes does not recognize husband.  She has not seen a neurologist.  Noted sundowning.   Looking at long-term care issues.    MMSE - Mini Mental State Exam 07/17/2016  Orientation to time 1  Orientation to Place 5  Registration 3  Attention/ Calculation 5  Recall 1  Language- name 2 objects 2  Language- repeat 1  Language- follow 3 step command 3  Language- read & follow direction 1  Write a sentence 1  Copy design 1  Total score 24    Relevant past medical, surgical, family and social history reviewed and updated as indicated. Interim medical history since our last visit reviewed. Allergies and medications reviewed and updated.  Review of Systems  Per HPI unless specifically indicated above     Objective:    BP 120/70   Pulse 69   Temp 98.1 F (36.7 C)   Wt 98 lb 1.6 oz (44.5 kg)   LMP  (LMP Unknown)   SpO2 98%   BMI 18.24 kg/m   Wt Readings from Last 3 Encounters:  07/17/16 98 lb 1.6 oz (44.5 kg)  07/06/16 97 lb 9.6 oz (44.3 kg)  04/20/16 100 lb (45.4 kg)    Physical Exam  Constitutional: She appears well-developed and well-nourished. No distress.  HENT:  Head: Normocephalic and atraumatic.  Eyes: Conjunctivae and lids are normal. Right eye exhibits no discharge. Left eye  exhibits no discharge. No scleral icterus.  Neck: Normal range of motion. Neck supple. No JVD present. Carotid bruit is not present.  Cardiovascular: Normal rate, regular rhythm and normal heart sounds.   Pulmonary/Chest: Effort normal and breath sounds normal.  Abdominal: Normal appearance. There is no splenomegaly or hepatomegaly.  Musculoskeletal: Normal range of motion.  Neurological: She is alert. She has normal strength. No cranial nerve deficit or sensory deficit.  Skin: Skin is warm, dry and intact. No rash noted. No pallor.  Psychiatric: She has a normal mood and affect. Her behavior is normal.  Very forgetful about recent events in this visit    Results for orders placed or performed in visit on 04/20/16  Influenza A & B (STAT)  Result Value Ref Range   Influenza A Negative Negative   Influenza B Negative Negative  CBC With Differential/Platelet  Result Value Ref Range   WBC 9.4 3.4 - 10.8 x10E3/uL   RBC 3.93 3.77 - 5.28 x10E6/uL   Hemoglobin 11.1 11.1 - 15.9 g/dL   Hematocrit 34.9 34.0 - 46.6 %   MCV 89 79 - 97 fL   MCH 28.2 26.6 - 33.0  pg   MCHC 31.8 31.5 - 35.7 g/dL   RDW 13.5 12.3 - 15.4 %   Platelets 210 150 - 379 x10E3/uL   Neutrophils 86 Not Estab. %   Lymphs 7 Not Estab. %   MID 8 Not Estab. %   Neutrophils Absolute 8.1 (H) 1.4 - 7.0 x10E3/uL   Lymphocytes Absolute 0.6 (L) 0.7 - 3.1 x10E3/uL   MID (Absolute) 0.7 0.1 - 1.6 X10E3/uL      Assessment & Plan:   Problem List Items Addressed This Visit      Unprioritized   Advanced care planning/counseling discussion    Long discussion with husband and daughter about goals of care.  Forms given and they plan to fill them out.  They are looking at the DNR and MOST forms.        CAD (coronary artery disease) - Primary    Continue current meds      Relevant Orders   Comprehensive metabolic panel   Chronic kidney disease   Relevant Orders   CBC with Differential/Platelet   Dementia    Mini mental 24/30.   Add Vitamin D 2,0000 IUs/day. Recommended not driving.   Probably vascular      Relevant Orders   TSH   Ambulatory referral to Neurology   Hypertension    Other Visit Diagnoses    History of UTI       Relevant Orders   UA/M w/rflx Culture, Routine   Annual physical exam          Greater than 50% of this  60 minute visit was spent in counseling/coordination of care regarding goals of care, advance care planning, and dementia progression  Follow up plan: Return in about 6 months (around 01/16/2017).

## 2016-07-17 NOTE — Assessment & Plan Note (Addendum)
Long discussion with husband and daughter about goals of care.  Forms given and they plan to fill them out.  They are looking at the DNR and MOST forms.

## 2016-07-18 LAB — COMPREHENSIVE METABOLIC PANEL
A/G RATIO: 1.6 (ref 1.2–2.2)
ALBUMIN: 4.4 g/dL (ref 3.5–4.8)
ALT: 12 IU/L (ref 0–32)
AST: 20 IU/L (ref 0–40)
Alkaline Phosphatase: 60 IU/L (ref 39–117)
BILIRUBIN TOTAL: 1 mg/dL (ref 0.0–1.2)
BUN / CREAT RATIO: 17 (ref 12–28)
BUN: 15 mg/dL (ref 8–27)
CHLORIDE: 103 mmol/L (ref 96–106)
CO2: 28 mmol/L (ref 20–29)
Calcium: 9.8 mg/dL (ref 8.7–10.3)
Creatinine, Ser: 0.89 mg/dL (ref 0.57–1.00)
GFR, EST AFRICAN AMERICAN: 74 mL/min/{1.73_m2} (ref >59)
GFR, EST NON AFRICAN AMERICAN: 65 mL/min/{1.73_m2} (ref >59)
Globulin, Total: 2.8 g/dL (ref 1.5–4.5)
Glucose: 93 mg/dL (ref 65–99)
POTASSIUM: 5 mmol/L (ref 3.5–5.2)
Sodium: 142 mmol/L (ref 134–144)
TOTAL PROTEIN: 7.2 g/dL (ref 6.0–8.5)

## 2016-07-18 LAB — CBC WITH DIFFERENTIAL/PLATELET
Basophils Absolute: 0 10*3/uL (ref 0.0–0.2)
Basos: 1 %
EOS (ABSOLUTE): 0.1 10*3/uL (ref 0.0–0.4)
EOS: 2 %
Hematocrit: 38.3 % (ref 34.0–46.6)
Hemoglobin: 12.5 g/dL (ref 11.1–15.9)
IMMATURE GRANS (ABS): 0 10*3/uL (ref 0.0–0.1)
IMMATURE GRANULOCYTES: 0 %
LYMPHS: 26 %
Lymphocytes Absolute: 1.1 10*3/uL (ref 0.7–3.1)
MCH: 27.6 pg (ref 26.6–33.0)
MCHC: 32.6 g/dL (ref 31.5–35.7)
MCV: 85 fL (ref 79–97)
MONOCYTES: 5 %
Monocytes Absolute: 0.2 10*3/uL (ref 0.1–0.9)
NEUTROS ABS: 2.7 10*3/uL (ref 1.4–7.0)
NEUTROS PCT: 66 %
Platelets: 229 10*3/uL (ref 150–379)
RBC: 4.53 x10E6/uL (ref 3.77–5.28)
RDW: 14.4 % (ref 12.3–15.4)
WBC: 4.1 10*3/uL (ref 3.4–10.8)

## 2016-07-18 LAB — TSH: TSH: 3.23 u[IU]/mL (ref 0.450–4.500)

## 2016-07-18 NOTE — Progress Notes (Signed)
Normal labs.  Pt notified through mychart

## 2016-07-24 ENCOUNTER — Other Ambulatory Visit: Payer: Self-pay | Admitting: Unknown Physician Specialty

## 2016-07-25 ENCOUNTER — Telehealth: Payer: Self-pay | Admitting: Unknown Physician Specialty

## 2016-07-25 NOTE — Telephone Encounter (Signed)
Spoke with Christine Burch, patient has been accidentally taking two rosuvastatin a day instead of 1, so she is short and insurance will not pay for them until the middle of July. Is it ok for patient to not take it until it can be refilled or should she take fish oil while she is out.

## 2016-07-25 NOTE — Telephone Encounter (Signed)
I think this is a good opportunity to see if her mental status clears a little with giving her a Crestor holiday.  I would stop and restart when she can get it filled.

## 2016-07-25 NOTE — Telephone Encounter (Signed)
Called and let patient's husband know what Malachy Mood said regarding medication.

## 2016-07-25 NOTE — Telephone Encounter (Signed)
Christine Burch called and stated that the pharmacy couldn't fill rosuvastatin (CRESTOR) 40 MG tablet until 08/04/16 and would like to know if there was anything else she could take until it could be filled or if she could be off of it that long.

## 2016-07-28 ENCOUNTER — Ambulatory Visit (INDEPENDENT_AMBULATORY_CARE_PROVIDER_SITE_OTHER): Payer: Medicare Other | Admitting: Neurology

## 2016-07-28 ENCOUNTER — Encounter: Payer: Self-pay | Admitting: Neurology

## 2016-07-28 VITALS — BP 112/58 | HR 61 | Resp 16 | Ht 62.0 in | Wt 98.0 lb

## 2016-07-28 DIAGNOSIS — I6522 Occlusion and stenosis of left carotid artery: Secondary | ICD-10-CM

## 2016-07-28 DIAGNOSIS — F0391 Unspecified dementia with behavioral disturbance: Secondary | ICD-10-CM | POA: Diagnosis not present

## 2016-07-28 DIAGNOSIS — F03B18 Unspecified dementia, moderate, with other behavioral disturbance: Secondary | ICD-10-CM

## 2016-07-28 MED ORDER — DONEPEZIL HCL 10 MG PO TABS: ORAL_TABLET | ORAL | 11 refills | Status: DC
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
## 2016-07-28 NOTE — Patient Instructions (Signed)
1. Schedule MRI brain without contrast 2. Start Aricept 10mg : Take 1/2 tablet daily for 1 month, then increase to 1 tablet daily 3. Control of blood pressure, cholesterol, as well as physical exercise and brain stimulation exercises are important for brain health 4. Follow-up in 6 months, call for any changes

## 2016-08-07 ENCOUNTER — Telehealth: Payer: Self-pay | Admitting: Neurology

## 2016-08-07 NOTE — Telephone Encounter (Signed)
Spoke with pt's husband, [name], how states that pt is more confused since starting Aricept.  She stated this morning that she really enjoyed visiting her parents last night (pt's parents have been deceased for over 15 years) and that she needs to "get her coins" from her parents house before they sell the house.  Please advise

## 2016-08-07 NOTE — Telephone Encounter (Signed)
Caller: [name] (Husband)   Urgent? No  Reason for the call: He feels the medication that she was just put on is making her more confused. Please call. Thanks

## 2016-08-07 NOTE — Telephone Encounter (Signed)
Not quite sure this is from the medication, but he can stop it, and if due to medication, there should be some improvement within the next week. Thanks

## 2016-08-07 NOTE — Telephone Encounter (Signed)
Spoke with pt husband, relaying message below.  He will stop her Aricept and call the office in about a week with an update.

## 2016-08-10 ENCOUNTER — Ambulatory Visit
Admission: RE | Admit: 2016-08-10 | Discharge: 2016-08-10 | Disposition: A | Discharge status: Home / Self Care | Payer: Medicare Other | Source: Ambulatory Visit | Attending: Neurology | Admitting: Neurology

## 2016-08-10 DIAGNOSIS — F03B18 Unspecified dementia, moderate, with other behavioral disturbance: Secondary | ICD-10-CM

## 2016-08-10 DIAGNOSIS — F0390 Unspecified dementia without behavioral disturbance: Secondary | ICD-10-CM | POA: Diagnosis not present

## 2016-08-10 DIAGNOSIS — F0391 Unspecified dementia with behavioral disturbance: Secondary | ICD-10-CM

## 2016-08-11 ENCOUNTER — Telehealth: Payer: Self-pay | Admitting: Unknown Physician Specialty

## 2016-08-11 ENCOUNTER — Other Ambulatory Visit: Payer: Self-pay | Admitting: Unknown Physician Specialty

## 2016-08-11 DIAGNOSIS — E7800 Pure hypercholesterolemia, unspecified: Secondary | ICD-10-CM

## 2016-08-11 DIAGNOSIS — F419 Anxiety disorder, unspecified: Secondary | ICD-10-CM

## 2016-08-11 NOTE — Telephone Encounter (Signed)
Patients husband called to see if Malachy Mood would do labs on patient since she has not been on her statin for a few weeks.  They are wanting to know how her cholesterol is doing.  [name] [phone] home [phone] cell  Thank You

## 2016-08-11 NOTE — Telephone Encounter (Signed)
Called and let patient's husband know that an order has been entered to check the patient's cholesterol. He stated they would come by next week fasting.

## 2016-08-11 NOTE — Telephone Encounter (Signed)
Routing to provider. Can we enter labs for patient and does she need to be fasting?

## 2016-08-14 ENCOUNTER — Other Ambulatory Visit: Payer: Medicare Other

## 2016-08-14 DIAGNOSIS — E7800 Pure hypercholesterolemia, unspecified: Secondary | ICD-10-CM | POA: Diagnosis not present

## 2016-08-15 ENCOUNTER — Telehealth: Payer: Self-pay | Admitting: Unknown Physician Specialty

## 2016-08-15 LAB — LIPID PANEL W/O CHOL/HDL RATIO
CHOLESTEROL TOTAL: 232 mg/dL — AB (ref 100–199)
HDL: 46 mg/dL (ref >39)
LDL Calculated: 165 mg/dL — ABNORMAL HIGH (ref 0–99)
TRIGLYCERIDES: 105 mg/dL (ref 0–149)
VLDL Cholesterol Cal: 21 mg/dL (ref 5–40)

## 2016-08-15 MED ORDER — EZETIMIBE 10 MG PO TABS: 10.0000 mg | ORAL_TABLET | Freq: Every day | ORAL | 3 refills | Status: DC

## 2016-08-15 NOTE — Telephone Encounter (Signed)
Routing to provider  

## 2016-08-15 NOTE — Telephone Encounter (Signed)
Discussed with pt that she seems better off statin.  Discussed data does not show prevention of cardiovascular events.  Will start Zetia per husband's request

## 2016-08-15 NOTE — Telephone Encounter (Signed)
Pts husband, Jermani Pund called and would like to have the pts rosuvastatin changed to something else. He stated that they would like to "get away from taking statins". He would like a new medication sent to [facility].

## 2016-08-17 ENCOUNTER — Telehealth: Payer: Self-pay

## 2016-08-17 NOTE — Telephone Encounter (Signed)
-----   Message from Cameron Sprang, MD sent at 08/16/2016  9:48 AM EDT ----- Pls let husband know the MRI brain did not show any evidence of tumor, stroke, or bleed. It showed age-related changes. Has he noticed any change with stopping the Aricept? Thanks

## 2016-08-17 NOTE — Telephone Encounter (Signed)
Spoke with pt's husband, [name], relaying message below.  He states that pt has been doing better since stopping Aricept.  She still has trouble with her memory but is no longer talking about her parents or wanting to go back to her childhood home.  Pt recently stopped taking all statins, per husbands request to PCP, and she has appointment in August to have her left carotid artery rechecked.  Husband was weepy towards the end of conversation stating that he is grateful that pt is making progress however it seems like 2 steps forward 1 step back.

## 2016-08-24 ENCOUNTER — Telehealth: Payer: Self-pay | Admitting: Unknown Physician Specialty

## 2016-08-24 NOTE — Telephone Encounter (Signed)
[name], pts husband called and stated that he would like to speak to PCP about Zetia. He feels as if this medication is causing more issues with her memory.

## 2016-08-25 NOTE — Telephone Encounter (Signed)
Please let pt know we are our of other good options.  It sounds like the risks of the medications outweigh the benefits

## 2016-08-25 NOTE — Telephone Encounter (Signed)
Called and spoke to patient's husband. He states that the Zetia is causing issues with the patient's memory. He states that he has stopped giving the patient this medication. He states that she was doing better while not taking the medication. He would like to have something different sent in to [facility]. He requests a small amount of a medication, like a 10 day supply, to make sure it is going to work before having a 30 day supply sent in.

## 2016-08-25 NOTE — Telephone Encounter (Signed)
Called and let patient's husband know what Malachy Mood said. Patient's husband asked about the patient taking have a tablet of the zetia and fish oil. I placed the patient on hold and asked Malachy Mood about both of these. She stated that the fish oil would probably not reduce the patient's cholesterol much, but that they could try it and that taking half the zetia would be fine. Relayed this information to the patient's husband.

## 2016-09-05 ENCOUNTER — Encounter: Payer: Self-pay | Admitting: General Surgery

## 2016-09-05 ENCOUNTER — Ambulatory Visit (INDEPENDENT_AMBULATORY_CARE_PROVIDER_SITE_OTHER): Payer: Medicare Other | Admitting: General Surgery

## 2016-09-05 ENCOUNTER — Ambulatory Visit: Payer: Self-pay

## 2016-09-05 VITALS — BP 110/70 | HR 72 | Resp 12 | Ht 62.0 in | Wt 98.0 lb

## 2016-09-05 DIAGNOSIS — I6522 Occlusion and stenosis of left carotid artery: Secondary | ICD-10-CM

## 2016-09-05 NOTE — Progress Notes (Signed)
Patient ID: Christine Burch, female   DOB: August 05, 1940, 76 y.o.   MRN: [id number]  Chief Complaint  Patient presents with  . Other    Carotid ultrasound    HPI Ane Christine Burch is a 76 y.o. female here today for her six month Carotid ultrasound. Patient states she is doing well.  HPI  Past Medical History:  Diagnosis Date  . Acid reflux   . Colon polyp 2012, 2014  . 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 Enlargement of lymph nodes 2013  . Gallstones   . High cholesterol   . Hypertension 2013  . Memory changes 2018  . Tumors 2006    Past Surgical History:  Procedure Laterality Date  . ABDOMINAL HYSTERECTOMY  1980  . CARDIAC SURGERY  2010   stint  . CAROTID ENDARTERECTOMY Right 2011  . CHOLECYSTECTOMY N/A 04/19/2015   Procedure: LAPAROSCOPIC CHOLECYSTECTOMY;  Surgeon: Christine Lye, MD;  Location: [facility];  Service: General;  Laterality: N/A;  . COLONOSCOPY  [id number]   Dr.Hashmi  . COLONOSCOPY WITH PROPOFOL N/A 03/31/2015   Procedure: COLONOSCOPY WITH PROPOFOL;  Surgeon: Christine Lye, MD;  Location: [facility];  Service: Endoscopy;  Laterality: N/A;  . COLONOSCOPY WITH PROPOFOL N/A 03/22/2016   Procedure: COLONOSCOPY WITH PROPOFOL;  Surgeon: Christine Lye, MD;  Location: [facility];  Service: Endoscopy;  Laterality: N/A;  . CORONARY STENT PLACEMENT    . EXCISION MASS ABDOMINAL N/A 04/19/2015   (GIST): EXCISION MASS ABDOMINAL/ EXCISION GASTRIC MASS;  Surgeon: Christine Lye, MD;  Location: [facility];  Service: General;  Laterality: N/A;  . LAPAROSCOPIC RIGHT COLECTOMY Right 04/19/2015   Procedure: LAPAROSCOPIC RIGHT COLECTOMY;  Surgeon: Christine Lye, MD;  Location: [facility];  Service: General;  Laterality: Right;  . UPPER GI ENDOSCOPY  2012, 2014    Family History  Problem Relation Age of Onset  . Heart disease Mother   . Heart disease Father   . Arthritis Sister   . Heart disease Sister   . Heart disease Maternal Grandmother    . Heart disease Maternal Grandfather   . Heart disease Paternal Grandmother   . Heart disease Paternal Grandfather   . Breast cancer Neg Hx     Social History Social History  Substance Use Topics  . Smoking status: Never Smoker  . Smokeless tobacco: Never Used  . Alcohol use No    No Known Allergies  Current Outpatient Prescriptions  Medication Sig Dispense Refill  . aspirin 81 MG tablet Take by mouth. 2 tablets daily    . Cholecalciferol (VITAMIN D3) 2000 units TABS Take 2,000 Units by mouth daily.    . clopidogrel (PLAVIX) 75 MG tablet Take 75 mg by mouth daily.    Christine Burch ezetimibe (ZETIA) 10 MG tablet Take 1 tablet (10 mg total) by mouth daily. 90 tablet 3  . losartan (COZAAR) 25 MG tablet TAKE ONE TABLET BY MOUTH ONCE DAILY 30 tablet 12  . mirtazapine (REMERON) 15 MG tablet TAKE ONE TABLET BY MOUTH AT BEDTIME 90 tablet 1  . ranitidine (ZANTAC) 150 MG tablet Take 150 mg by mouth 2 (two) times daily.      No current facility-administered medications for this visit.     Review of Systems Review of Systems  Constitutional: Negative.   Respiratory: Negative.   Cardiovascular: Negative.     Blood pressure 110/70, pulse 72, resp. rate 12, height 5\' 2"  (1.575 m), weight 98 lb (44.5 kg).  Physical Exam Physical Exam  Constitutional: She is oriented to person, place, and time. She appears well-developed and well-nourished.  Neurological: She is alert and oriented to person, place, and time.  Skin: Skin is warm and dry.    Data Reviewed Prior notes reviewed   Assessment     carotid artery stenosis asymptomatic, status post right carotid endarterectomy  Plan     Duplex study of the left carotid artery was performed. 6 months ago showing noted to have some mild progression of the plaque on the left side. Today's study reveals no changes from the last evaluation. There is some fairly smooth calcific plaque that extends from the area of the bifurcation into the internal  carotid. His proximally 1.06 cm long and 0.27 cm in thickness. Based on the Doppler flow and the anatomical findings is estimated there is 40-50% narrowing at the site. External carotid is not involved. Antegrade flow is noted in the vertebral artery  Patient to return as needed. The patient is aware to call back for any questions or concerns.  Patient to return as needed. Follow-up with carotid ultrasound in 1 year with Dr. Delana Meyer or Dr. Lucky Cowboy with vascular   Follow up in 5 years with Colonoscopy with Dr Bary Castilla   HPI, Physical Exam, Assessment and Plan have been scribed under the direction and in the presence of Mckinley Jewel, MD  Gaspar Cola, CMA I have completed the exam and reviewed the above documentation for accuracy and completeness.  I agree with the above.  Haematologist has been used and any errors in dictation or transcription are unintentional.  Christine Burch Christine Burch, M.D., F.A.C.S.   Christine Burch G 09/11/2016, 2:43 PM

## 2016-09-05 NOTE — Patient Instructions (Addendum)
Patient to return as needed. Follow-up with carotid ultrasound in 1 year with Dr. Delana Meyer or Dr. Lucky Cowboy with vascular   Colonoscopy in 5 years with Dr Bary Castilla

## 2016-09-13 DIAGNOSIS — I10 Essential (primary) hypertension: Secondary | ICD-10-CM | POA: Diagnosis not present

## 2016-09-13 DIAGNOSIS — I679 Cerebrovascular disease, unspecified: Secondary | ICD-10-CM | POA: Diagnosis not present

## 2016-09-13 DIAGNOSIS — E785 Hyperlipidemia, unspecified: Secondary | ICD-10-CM | POA: Diagnosis not present

## 2016-09-13 DIAGNOSIS — Z9889 Other specified postprocedural states: Secondary | ICD-10-CM | POA: Diagnosis not present

## 2016-09-19 DIAGNOSIS — H2513 Age-related nuclear cataract, bilateral: Secondary | ICD-10-CM | POA: Diagnosis not present

## 2016-09-21 ENCOUNTER — Other Ambulatory Visit: Payer: Self-pay | Admitting: Unknown Physician Specialty

## 2016-09-21 NOTE — Telephone Encounter (Signed)
Your patient 

## 2016-09-22 ENCOUNTER — Telehealth: Payer: Self-pay | Admitting: Unknown Physician Specialty

## 2016-09-22 NOTE — Telephone Encounter (Signed)
Called and let patient's husband know that prescription was sent in for them and nothing further was needed from [place].

## 2016-09-22 NOTE — Telephone Encounter (Signed)
Called pharmacy to see if anything was needed from [place] because prescription was sent in this morning. Pharmacy stated that they were getting the prescription ready now for the patient. Will call patient's husband and let him know.

## 2016-09-22 NOTE — Telephone Encounter (Signed)
Patients husband called to get the patients losartan refilled.  She said that the pharmacy said he needed to speak with someone at patients providers office. He requested [name] to return his call if she would.  Thank you      [phone] [name]

## 2016-10-01 ENCOUNTER — Other Ambulatory Visit: Payer: Self-pay | Admitting: Unknown Physician Specialty

## 2016-10-02 DIAGNOSIS — H1132 Conjunctival hemorrhage, left eye: Secondary | ICD-10-CM | POA: Diagnosis not present

## 2017-01-29 ENCOUNTER — Encounter: Payer: Self-pay | Admitting: Unknown Physician Specialty

## 2017-01-29 ENCOUNTER — Ambulatory Visit (INDEPENDENT_AMBULATORY_CARE_PROVIDER_SITE_OTHER): Payer: Medicare Other | Admitting: Unknown Physician Specialty

## 2017-01-29 VITALS — BP 148/72 | HR 71 | Temp 98.1°F | Wt 105.6 lb

## 2017-01-29 DIAGNOSIS — I10 Essential (primary) hypertension: Secondary | ICD-10-CM | POA: Diagnosis not present

## 2017-01-29 DIAGNOSIS — Z66 Do not resuscitate: Secondary | ICD-10-CM

## 2017-01-29 DIAGNOSIS — F0390 Unspecified dementia without behavioral disturbance: Secondary | ICD-10-CM | POA: Diagnosis not present

## 2017-01-29 DIAGNOSIS — E7800 Pure hypercholesterolemia, unspecified: Secondary | ICD-10-CM

## 2017-01-29 DIAGNOSIS — Z7189 Other specified counseling: Secondary | ICD-10-CM | POA: Diagnosis not present

## 2017-01-29 NOTE — Assessment & Plan Note (Signed)
Goals of care discussed.  Has a living will and HCPOA done through lawyer.  Asked to bring the documents in to be scanned

## 2017-01-29 NOTE — Assessment & Plan Note (Addendum)
Check lipid panel today.  Husband does not want to restart a statin

## 2017-01-29 NOTE — Assessment & Plan Note (Signed)
Husband OK with keeping her at home for now.  Discussed community resources.

## 2017-01-29 NOTE — Assessment & Plan Note (Signed)
Stable, continue present medications.   

## 2017-01-29 NOTE — Progress Notes (Signed)
BP (!) 148/72   Pulse 71   Temp 98.1 F (36.7 C) (Oral)   Wt 105 lb 9.6 oz (47.9 kg)   LMP  (LMP Unknown)   SpO2 97%   BMI 19.31 kg/m    Subjective:    Patient ID: Christine Burch, female    DOB: 1940-01-27, 77 y.o.   MRN: [id number]  HPI: Christine Burch is a 77 y.o. female  Chief Complaint  Patient presents with  . Dementia  . Hypertension   Dementia This was evaluated by neurology and Aricept made things worse.  Cared for at home by her husband.  She is able to be left by herself for short periods of time.  Not driving.    Hypercholesterol Taking Zetia and stopped the statin to see if it would improve memory.  Husband would like her cholesterol to be checked  Hypertension High today.  Normally good.  Irritated by sitting here Average home BPs   No problems or lightheadedness No chest pain with exertion or shortness of breath No Edema  Relevant past medical, surgical, family and social history reviewed and updated as indicated. Interim medical history since our last visit reviewed. Allergies and medications reviewed and updated.  Review of Systems  Constitutional: Negative.   Respiratory: Negative.   Cardiovascular: Negative.   Psychiatric/Behavioral: Positive for agitation.    Per HPI unless specifically indicated above     Objective:    BP (!) 148/72   Pulse 71   Temp 98.1 F (36.7 C) (Oral)   Wt 105 lb 9.6 oz (47.9 kg)   LMP  (LMP Unknown)   SpO2 97%   BMI 19.31 kg/m   Wt Readings from Last 3 Encounters:  01/29/17 105 lb 9.6 oz (47.9 kg)  09/05/16 98 lb (44.5 kg)  07/28/16 98 lb (44.5 kg)    Physical Exam  Constitutional: She is oriented to person, place, and time. She appears well-developed and well-nourished. No distress.  HENT:  Head: Normocephalic and atraumatic.  Eyes: Conjunctivae and lids are normal. Right eye exhibits no discharge. Left eye exhibits no discharge. No scleral icterus.  Neck: Normal range of motion. Neck supple. No JVD  present. Carotid bruit is not present.  Cardiovascular: Normal rate, regular rhythm and normal heart sounds.  Pulmonary/Chest: Effort normal and breath sounds normal.  Abdominal: Normal appearance. There is no splenomegaly or hepatomegaly.  Musculoskeletal: Normal range of motion.  Neurological: She is alert and oriented to person, place, and time.  Skin: Skin is warm, dry and intact. No rash noted. No pallor.  Psychiatric: She has a normal mood and affect. Her behavior is normal. Judgment and thought content normal.    Results for orders placed or performed in visit on 08/14/16  Lipid Panel w/o Chol/HDL Ratio  Result Value Ref Range   Cholesterol, Total 232 (H) 100 - 199 mg/dL   Triglycerides 105 0 - 149 mg/dL   HDL 46 >39 mg/dL   VLDL Cholesterol Cal 21 5 - 40 mg/dL   LDL Calculated 165 (H) 0 - 99 mg/dL      Assessment & Plan:   Problem List Items Addressed This Visit      Unprioritized   Advanced care planning/counseling discussion    Goals of care discussed.  Has a living will and HCPOA done through lawyer.  Asked to bring the documents in to be scanned      Dementia    Husband OK with keeping her at home for now.  Discussed  community resources.        DNR (do not resuscitate)    Discussed DNR and how it was different than Living Will.  Husband is her 65 and wishes her to be a DNR.        High cholesterol - Primary    Check lipid panel today.  Husband does not want to restart a statin       Relevant Orders   Lipid Panel w/o Chol/HDL Ratio   Hypertension    Stable, continue present medications.        Relevant Orders   Comprehensive metabolic panel       Follow up plan: Return in about 6 months (around 07/29/2017).

## 2017-01-29 NOTE — Assessment & Plan Note (Signed)
Discussed DNR and how it was different than Living Will.  Husband is her 23 and wishes her to be a DNR.

## 2017-01-30 LAB — COMPREHENSIVE METABOLIC PANEL
A/G RATIO: 1.4 (ref 1.2–2.2)
ALBUMIN: 4.2 g/dL (ref 3.5–4.8)
ALT: 12 IU/L (ref 0–32)
AST: 19 IU/L (ref 0–40)
Alkaline Phosphatase: 69 IU/L (ref 39–117)
BILIRUBIN TOTAL: 0.5 mg/dL (ref 0.0–1.2)
BUN / CREAT RATIO: 15 (ref 12–28)
BUN: 11 mg/dL (ref 8–27)
CO2: 23 mmol/L (ref 20–29)
Calcium: 9.5 mg/dL (ref 8.7–10.3)
Chloride: 106 mmol/L (ref 96–106)
Creatinine, Ser: 0.74 mg/dL (ref 0.57–1.00)
GFR calc non Af Amer: 80 mL/min/{1.73_m2} (ref >59)
GFR, EST AFRICAN AMERICAN: 92 mL/min/{1.73_m2} (ref >59)
GLOBULIN, TOTAL: 3.1 g/dL (ref 1.5–4.5)
Glucose: 90 mg/dL (ref 65–99)
POTASSIUM: 4.7 mmol/L (ref 3.5–5.2)
Sodium: 144 mmol/L (ref 134–144)
TOTAL PROTEIN: 7.3 g/dL (ref 6.0–8.5)

## 2017-01-30 LAB — LIPID PANEL W/O CHOL/HDL RATIO
Cholesterol, Total: 237 mg/dL — ABNORMAL HIGH (ref 100–199)
HDL: 60 mg/dL (ref >39)
LDL Calculated: 153 mg/dL — ABNORMAL HIGH (ref 0–99)
Triglycerides: 119 mg/dL (ref 0–149)
VLDL Cholesterol Cal: 24 mg/dL (ref 5–40)

## 2017-01-30 NOTE — Progress Notes (Signed)
Notified pt by mychart

## 2017-02-02 ENCOUNTER — Ambulatory Visit: Payer: Medicare Other | Admitting: Neurology

## 2017-02-06 ENCOUNTER — Other Ambulatory Visit: Payer: Self-pay | Admitting: Unknown Physician Specialty

## 2017-02-06 ENCOUNTER — Telehealth: Payer: Self-pay | Admitting: Unknown Physician Specialty

## 2017-02-06 DIAGNOSIS — F419 Anxiety disorder, unspecified: Secondary | ICD-10-CM

## 2017-02-06 NOTE — Telephone Encounter (Signed)
Copied from [place] [phone]. Topic: Quick Communication - See Telephone Encounter >> Feb 06, 2017  4:58 PM Cleaster Corin, NT wrote: CRM for notification. See Telephone encounter for:   02/06/17. Pt. Husband calling and stating that pt. Is wanting refill on med. Mirtazapine but med. Is no longer on pts. Med list and he called pharmacy and it can not be filled. Wants to know why. Pt. Can be reached at [phone]

## 2017-02-07 MED ORDER — MIRTAZAPINE 15 MG PO TABS
15.0000 mg | ORAL_TABLET | Freq: Every day | ORAL | 3 refills | Status: DC
Start: 2017-02-07 — End: 2017-11-23

## 2017-02-07 NOTE — Telephone Encounter (Signed)
Called and let patient's husband know that the RX has been sent in. Sent to the wrong pharmacy my accident so I called [facility] [place] and called the prescription in to [facility] for the patient.

## 2017-02-07 NOTE — Telephone Encounter (Signed)
Routing to provider  

## 2017-02-26 DIAGNOSIS — I10 Essential (primary) hypertension: Secondary | ICD-10-CM | POA: Diagnosis not present

## 2017-02-26 DIAGNOSIS — E7800 Pure hypercholesterolemia, unspecified: Secondary | ICD-10-CM | POA: Diagnosis not present

## 2017-02-26 DIAGNOSIS — Z9889 Other specified postprocedural states: Secondary | ICD-10-CM | POA: Diagnosis not present

## 2017-02-26 DIAGNOSIS — Z955 Presence of coronary angioplasty implant and graft: Secondary | ICD-10-CM | POA: Diagnosis not present

## 2017-04-19 ENCOUNTER — Encounter: Payer: Self-pay | Admitting: Family Medicine

## 2017-04-19 ENCOUNTER — Ambulatory Visit (INDEPENDENT_AMBULATORY_CARE_PROVIDER_SITE_OTHER): Payer: Medicare Other | Admitting: Family Medicine

## 2017-04-19 VITALS — BP 107/68 | HR 68 | Temp 97.8°F | Ht 62.0 in | Wt 109.0 lb

## 2017-04-19 DIAGNOSIS — R1907 Generalized intra-abdominal and pelvic swelling, mass and lump: Secondary | ICD-10-CM

## 2017-04-19 DIAGNOSIS — E7800 Pure hypercholesterolemia, unspecified: Secondary | ICD-10-CM

## 2017-04-19 NOTE — Progress Notes (Signed)
BP 107/68   Pulse 68   Temp 97.8 F (36.6 C) (Oral)   Ht 5\' 2"  (1.575 m)   Wt 109 lb (49.4 kg)   LMP  (LMP Unknown)   SpO2 99%   BMI 19.94 kg/m    Subjective:    Patient ID: Christine Burch, female    DOB: April 05, 1940, 77 y.o.   MRN: [id number]  HPI: Christine Burch is a 77 y.o. female  Chief Complaint  Patient presents with  . Weight Gain   Pt with dementia brought in today by husband for evaluation of abnormal weight gain. Has gone up about 4-6 pants sizes in the last few months. States the weight gain has been specifically in lower abdomen/pelvis with no dietary or routine changes. Remeron is not new, has been on that for a few years. Denies abdominal pain, constipation, vaginal bleeding, fatigue above baseline. Husband reports a hx of multiple benign masses throughout chest and abdomen over the years.  Recent colonoscopy and CT abdomen pelvis 02/2016 which were both benign per my chart review.   Husband also requesting cholesterol to be rechecked as they've made some medication changes and come off statins.   Past Medical History:  Diagnosis Date  . Acid reflux   . Colon polyp 2012, 2014  . 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 Enlargement of lymph nodes 2013  . Gallstones   . High cholesterol   . Hypertension 2013  . Memory changes 2018  . Tumors 2006   Social History   Socioeconomic History  . Marital status: Married    Spouse name: Not on file  . Number of children: Not on file  . Years of education: Not on file  . Highest education level: Not on file  Occupational History  . Not on file  Social Needs  . Financial resource strain: Not on file  . Food insecurity:    Worry: Not on file    Inability: Not on file  . Transportation needs:    Medical: Not on file    Non-medical: Not on file  Tobacco Use  . Smoking status: Never Smoker  . Smokeless tobacco: Never Used  Substance and Sexual Activity  . Alcohol use: No    Alcohol/week: 0.0 oz  . Drug  use: No  . Sexual activity: Never  Lifestyle  . Physical activity:    Days per week: Not on file    Minutes per session: Not on file  . Stress: Not on file  Relationships  . Social connections:    Talks on phone: Not on file    Gets together: Not on file    Attends religious service: Not on file    Active member of club or organization: Not on file    Attends meetings of clubs or organizations: Not on file    Relationship status: Not on file  . Intimate partner violence:    Fear of current or ex partner: Not on file    Emotionally abused: Not on file    Physically abused: Not on file    Forced sexual activity: Not on file  Other Topics Concern  . Not on file  Social History Narrative  . Not on file    Relevant past medical, surgical, family and social history reviewed and updated as indicated. Interim medical history since our last visit reviewed. Allergies and medications reviewed and updated.  Review of Systems  Unable to perform ROS: Dementia    Per HPI  unless specifically indicated above     Objective:    BP 107/68   Pulse 68   Temp 97.8 F (36.6 C) (Oral)   Ht 5\' 2"  (1.575 m)   Wt 109 lb (49.4 kg)   LMP  (LMP Unknown)   SpO2 99%   BMI 19.94 kg/m   Wt Readings from Last 3 Encounters:  04/19/17 109 lb (49.4 kg)  01/29/17 105 lb 9.6 oz (47.9 kg)  09/05/16 98 lb (44.5 kg)    Physical Exam  Constitutional: No distress.  HENT:  Head: Atraumatic.  Eyes: Pupils are equal, round, and reactive to light. Conjunctivae are normal. No scleral icterus.  Neck: Normal range of motion. Neck supple.  Cardiovascular: Normal rate and normal heart sounds.  Pulmonary/Chest: Effort normal and breath sounds normal. No respiratory distress. She has no wheezes.  Abdominal: Bowel sounds are normal. She exhibits distension (Lower abdomen/pelvic area firm and moderately distended in marked way, symmetric b/l). There is no tenderness. There is no guarding.  Musculoskeletal: Normal  range of motion.  Lymphadenopathy:    She has no cervical adenopathy.  Neurological: She is alert.  Skin: Skin is warm and dry. She is not diaphoretic.  Psychiatric: She has a normal mood and affect. Her behavior is normal.  Nursing note and vitals reviewed.   Results for orders placed or performed in visit on 04/19/17  Lipid Panel w/o Chol/HDL Ratio  Result Value Ref Range   Cholesterol, Total 211 (H) 100 - 199 mg/dL   Triglycerides 145 0 - 149 mg/dL   HDL 50 >39 mg/dL   VLDL Cholesterol Cal 29 5 - 40 mg/dL   LDL Calculated 132 (H) 0 - 99 mg/dL      Assessment & Plan:   Problem List Items Addressed This Visit      Other   High cholesterol    Will recheck cholesterol today, continue lifestyle modifications      Relevant Orders   Lipid Panel w/o Chol/HDL Ratio (Completed)    Other Visit Diagnoses    Generalized abdominal or pelvic swelling or mass or lump    -  Primary   Will get abdominal and pelvic ultrasounds to r/o masses or other anatomic abnormalities given marked localized distention and 4 size pant increase   Relevant Orders   US Pelvis Complete   US Abdomen Complete       Follow up plan: Return for as scheduled .

## 2017-04-20 LAB — LIPID PANEL W/O CHOL/HDL RATIO
CHOLESTEROL TOTAL: 211 mg/dL — AB (ref 100–199)
HDL: 50 mg/dL (ref >39)
LDL Calculated: 132 mg/dL — ABNORMAL HIGH (ref 0–99)
Triglycerides: 145 mg/dL (ref 0–149)
VLDL CHOLESTEROL CAL: 29 mg/dL (ref 5–40)

## 2017-04-22 NOTE — Assessment & Plan Note (Signed)
Will recheck cholesterol today, continue lifestyle modifications

## 2017-04-22 NOTE — Patient Instructions (Signed)
Follow up as scheduled.  

## 2017-04-23 ENCOUNTER — Telehealth: Payer: Self-pay | Admitting: Family Medicine

## 2017-04-23 NOTE — Telephone Encounter (Signed)
Husband notified of information and verbalized understanding.

## 2017-04-23 NOTE — Telephone Encounter (Signed)
Please call and let them know I had to order abdominal and pelvic ultrasounds rather than a CT as the CT was not getting covered

## 2017-04-26 ENCOUNTER — Telehealth: Payer: Self-pay

## 2017-04-26 ENCOUNTER — Telehealth: Payer: Self-pay | Admitting: Unknown Physician Specialty

## 2017-04-26 DIAGNOSIS — R1907 Generalized intra-abdominal and pelvic swelling, mass and lump: Secondary | ICD-10-CM

## 2017-04-26 NOTE — Telephone Encounter (Signed)
Ultrasound needs order for transvaginal as well for diagnosis.

## 2017-04-26 NOTE — Telephone Encounter (Signed)
Order entered. See other telephone encounter.

## 2017-04-26 NOTE — Telephone Encounter (Signed)
Copied from [place] [phone]. Topic: Quick Communication - See Telephone Encounter >> Apr 26, 2017 12:13 PM Rutherford Nail, NT wrote: CRM for notification. See Telephone encounter for: 04/26/17.  Radiology [facility] in regards to this patient. States the order they received is just a pelvic order. They are needing a pelvic complete with trans vaginal image 4,000.  Can call imaging at [phone]

## 2017-04-26 NOTE — Telephone Encounter (Signed)
Copied from [place] [phone]. Topic: Quick Communication - See Telephone Encounter >> Apr 26, 2017 12:13 PM Rutherford Nail, NT wrote: CRM for notification. See Telephone encounter for: 04/26/17.  Radiology [facility] in regards to this patient. States the order they received is just a pelvic order. They are needing a pelvic complete with trans vaginal image 4,000.  Can call imaging at [phone]

## 2017-04-26 NOTE — Telephone Encounter (Signed)
Order entered

## 2017-04-27 NOTE — Telephone Encounter (Signed)
Appointment scheduled.

## 2017-05-03 ENCOUNTER — Ambulatory Visit
Admission: RE | Admit: 2017-05-03 | Discharge: 2017-05-03 | Disposition: A | Discharge status: Home / Self Care | Payer: Medicare Other | Source: Ambulatory Visit | Attending: Family Medicine | Admitting: Family Medicine

## 2017-05-03 DIAGNOSIS — N281 Cyst of kidney, acquired: Secondary | ICD-10-CM | POA: Diagnosis not present

## 2017-05-03 DIAGNOSIS — R140 Abdominal distension (gaseous): Secondary | ICD-10-CM | POA: Diagnosis not present

## 2017-05-03 DIAGNOSIS — Z9049 Acquired absence of other specified parts of digestive tract: Secondary | ICD-10-CM | POA: Insufficient documentation

## 2017-05-03 DIAGNOSIS — Z90710 Acquired absence of both cervix and uterus: Secondary | ICD-10-CM | POA: Insufficient documentation

## 2017-05-03 DIAGNOSIS — R1907 Generalized intra-abdominal and pelvic swelling, mass and lump: Secondary | ICD-10-CM

## 2017-05-07 ENCOUNTER — Telehealth: Payer: Self-pay | Admitting: Unknown Physician Specialty

## 2017-05-07 NOTE — Telephone Encounter (Unsigned)
Copied from [place] [phone]. Topic: Quick Communication - See Telephone Encounter >> May 07, 2017  1:08 PM Hewitt Shorts wrote: CRM for notification. See Telephone encounter for: 05/07/17.pt husband called stating that they just got generic zetia 10mg  from the  pharmacy on Friday and pt has misplaced them and they need [facility]   Best number for patient husband -[phone] and home is [phone]

## 2017-05-07 NOTE — Telephone Encounter (Signed)
Please disregard wrong informattion was requested

## 2017-05-07 NOTE — Telephone Encounter (Signed)
Copied from [place] [phone]. Topic: Quick Communication - See Telephone Encounter >> May 07, 2017 11:12 AM Hewitt Shorts wrote: CRM for notification. See Telephone encounter for: 05/07/17. [facility] if the rx that was sent edarbyclor 40/12.5 cna be changed to somethiing else because it will not be covered   Best number [phone]

## 2017-05-08 MED ORDER — EZETIMIBE 10 MG PO TABS: 10.0000 mg | ORAL_TABLET | Freq: Every day | ORAL | 3 refills | Status: DC

## 2017-05-08 NOTE — Telephone Encounter (Signed)
Patient spouse calling again, requesting generic of zetia.

## 2017-05-08 NOTE — Telephone Encounter (Signed)
Pt husband [name] calling back about prescription Zetia he's upset and would like to speak with Environmental education officer I advised him that it can take up to 48-72 hours for a med refill he states that the provider has time to call people to let them know what they are going to do Larrys number [phone] or [phone] his wife will be out of this med today he states that his wife had the medicine in her hand last Friday and she misplaced it she has dementia

## 2017-05-08 NOTE — Telephone Encounter (Signed)
Called and spoke to patient's husband. He asked why this was not taken care of yesterday. I explained to the husband that the message did not come to our office until after 5 pm and apologized to him for this. Also let patient know that we have a 48 hour turn around for medication refills and prescriptions. Patient's husband stated I know that but sometimes things need to be taken care of sooner. Again apologized to the patient's husband. Clarified pharmacy with patient's husband and updated the chart. Let patient's husband know that I was sending the message to [facility] now for the rx to be sent in.

## 2017-05-16 NOTE — Telephone Encounter (Signed)
Done

## 2017-05-22 DIAGNOSIS — M179 Osteoarthritis of knee, unspecified: Secondary | ICD-10-CM | POA: Diagnosis not present

## 2017-05-22 DIAGNOSIS — M25561 Pain in right knee: Secondary | ICD-10-CM | POA: Diagnosis not present

## 2017-06-05 ENCOUNTER — Telehealth: Payer: Self-pay | Admitting: Unknown Physician Specialty

## 2017-06-05 NOTE — Telephone Encounter (Signed)
Copied from [place] [phone]. Topic: Medicare AWV >> Jun 05, 2017  1:20 PM Leo Rod wrote: Called to schedule Medicare Annual Wellness Visit with Nurse Health Advisor. If patient returns call, please note: their last AWV was on 6/14 /18 please schedule AWV with NHA any date after July 06 2017  Thank you! For any questions please contact: Jill Alexanders [phone]  Skype [email or web address]

## 2017-06-05 NOTE — Telephone Encounter (Signed)
Copied from [place] [phone]. Topic: Medicare AWV >> Jun 05, 2017  1:20 PM Leo Rod wrote: Called to schedule Medicare Annual Wellness Visit with Nurse Health Advisor. If patient returns call, please note: their last AWV was on 6/14 /18 please schedule AWV with NHA any date after July 06 2017  Thank you! For any questions please contact: Jill Alexanders [phone]  Skype [email or web address]

## 2017-07-30 ENCOUNTER — Ambulatory Visit (INDEPENDENT_AMBULATORY_CARE_PROVIDER_SITE_OTHER): Payer: Medicare Other

## 2017-07-30 ENCOUNTER — Ambulatory Visit (INDEPENDENT_AMBULATORY_CARE_PROVIDER_SITE_OTHER): Payer: Medicare Other | Admitting: Unknown Physician Specialty

## 2017-07-30 ENCOUNTER — Encounter: Payer: Self-pay | Admitting: Unknown Physician Specialty

## 2017-07-30 VITALS — BP 102/60 | HR 59 | Temp 97.6°F | Resp 16 | Ht 62.0 in | Wt 114.7 lb

## 2017-07-30 VITALS — BP 102/60 | HR 59 | Temp 97.6°F | Ht 62.0 in | Wt 114.7 lb

## 2017-07-30 DIAGNOSIS — I10 Essential (primary) hypertension: Secondary | ICD-10-CM | POA: Diagnosis not present

## 2017-07-30 DIAGNOSIS — Z0000 Encounter for general adult medical examination without abnormal findings: Secondary | ICD-10-CM

## 2017-07-30 DIAGNOSIS — Z23 Encounter for immunization: Secondary | ICD-10-CM

## 2017-07-30 DIAGNOSIS — F0390 Unspecified dementia without behavioral disturbance: Secondary | ICD-10-CM

## 2017-07-30 NOTE — Assessment & Plan Note (Addendum)
Low normal BP.  Will stop Losartan

## 2017-07-30 NOTE — Progress Notes (Signed)
Subjective:   Christine Burch is a 77 y.o. female who presents for Medicare Annual (Subsequent) preventive examination.  Review of Systems:   Cardiac Risk Factors include: advanced age (>72men, >79 women);dyslipidemia;hypertension     Objective:     Vitals: BP 102/60 (BP Location: Right Arm, Patient Position: Sitting)   Pulse (!) 59   Temp 97.6 F (36.4 C) (Temporal)   Resp 16   Ht 5\' 2"  (1.575 m)   Wt 114 lb 11.2 oz (52 kg)   LMP  (LMP Unknown)   SpO2 96%   BMI 20.98 kg/m   Body mass index is 20.98 kg/m.  Advanced Directives 07/30/2017 07/06/2016 03/22/2016 04/19/2015 04/13/2015 10/27/2014  Does Patient Have a Medical Advance Directive? Yes Yes No Yes Yes Yes  Type of Advance Directive Living will;Healthcare Power of Attorney Living will;Healthcare Power of Attorney - Living will Living will Living will  Copy of [place] in Chart? 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 Acid reflux   . Colon polyp 2012, 2014  . 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 Enlargement of lymph nodes 2013  . Gallstones   . High cholesterol   . Hypertension 2013  . Memory changes 2018  . Tumors 2006   Past Surgical History:  Procedure Laterality Date  .  ABDOMINAL HYSTERECTOMY  1980  . CARDIAC SURGERY  2010   stint  . CAROTID ENDARTERECTOMY Right 2011  . CHOLECYSTECTOMY N/A 04/19/2015   Procedure: LAPAROSCOPIC CHOLECYSTECTOMY;  Surgeon: [name], MD;  Location: [facility] OR[facility];  Service: General;  Laterality: N/A;  . COLONOSCOPY  [id number]   Dr.Hashmi  . COLONOSCOPY WITH PROPOFOL N/A 03/31/2015   Procedure: COLONOSCOPY WITH PROPOFOL;  Surgeon: [name], MD;  Location: [facility];  Service: Endoscopy;  Laterality: N/A;  . COLONOSCOPY WITH PROPOFOL N/A 03/22/2016   Procedure: COLONOSCOPY WITH PROPOFOL;  Surgeon: [name], MD;  Location: [facility] END[facility];  Service: Endoscopy;  Laterality: N/A;  . CORONARY STENT PLACEMENT    . EXCISION MASS ABDOMINAL N/A 04/19/2015   (GIST): EXCISION MASS ABDOMINAL/ EXCISION GASTRIC MASS;  Surgeon: [name], MD;  Location: [facility];  Service: General;  Laterality: N/A;  . LAPAROSCOPIC RIGHT COLECTOMY Right 04/19/2015   Procedure: LAPAROSCOPIC RIGHT COLECTOMY;  Surgeon: [name], MD;  Location: [facility];  Service: General;  Laterality: Right;  . UPPER GI ENDOSCOPY  2012, 2014   Family History  Problem Relation Age of Onset  . Heart disease Mother   . Heart disease Father   . Arthritis Sister   . Heart disease Sister   . Heart disease Maternal Grandmother   . Heart disease Maternal Grandfather   . Heart  disease Paternal Grandmother   . Heart disease Paternal Grandfather   . Breast cancer Neg Hx    Social History   Socioeconomic History  . Marital status: Married    Spouse name: Not on file  . Number of children: Not on file  . Years of education: Not on file  . Highest education level: Not on file  Occupational History  . Not on file  Social Needs  . Financial resource strain: Not hard at all  . Food insecurity:    Worry: Never true    Inability: Never true  . Transportation needs:    Medical: No    Non-medical: No  Tobacco Use  .  Smoking status: Never Smoker  . Smokeless tobacco: Never Used  Substance and Sexual Activity  . Alcohol use: No    Alcohol/week: 0.0 oz  . Drug use: No  . Sexual activity: Never  Lifestyle  . Physical activity:    Days per week: 0 days    Minutes per session: 0 min  . Stress: Not at all  Relationships  . Social connections:    Talks on phone: Never    Gets together: More than three times a week    Attends religious service: More than 4 times per year    Active member of club or organization: No    Attends meetings of clubs or organizations: Never    Relationship status: Married  Other Topics Concern  . Not on file  Social History Narrative  . Not on file    Outpatient Encounter Medications as of 07/30/2017  Medication Sig  . aspirin 81 MG tablet Take by mouth. 2 tablets daily  . Cholecalciferol (VITAMIN D3) 2000 units TABS Take 2,000 Units by mouth daily.  . clopidogrel (PLAVIX) 75 MG tablet Take 75 mg by mouth daily.  Marland Kitchen ezetimibe (ZETIA) 10 MG tablet Take 1 tablet (10 mg total) by mouth daily.  Marland Kitchen losartan (COZAAR) 25 MG tablet TAKE ONE TABLET BY MOUTH ONCE DAILY  . mirtazapine (REMERON) 15 MG tablet Take 1 tablet (15 mg total) by mouth at bedtime.  . naproxen (NAPROSYN) 500 MG tablet   . omega-3 fish oil (MAXEPA) 1000 MG CAPS capsule Take by mouth.  . ranitidine (ZANTAC) 150 MG tablet TAKE ONE TABLET BY MOUTH TWICE DAILY   No facility-administered encounter medications on file as of 07/30/2017.     Activities of Daily Living In your present state of health, do you have any difficulty performing the following activities: 07/30/2017  Hearing? N  Vision? N  Difficulty concentrating or making decisions? Y  Walking or climbing stairs? N  Dressing or bathing? Y  Comment hubsand assists   Doing errands, shopping? Y  Comment husbands assists   Conservation officer, nature and eating ? N  Using the Toilet? Y  Comment husband assists   In the past six months, have you accidently leaked  urine? Y  Comment wears depends   Do you have problems with loss of bowel control? Y  Comment wears depends   Managing your Medications? Y  Comment husband assists   Managing your Finances? Y  Comment husband assists  Housekeeping or managing your Housekeeping? Y  Comment husband assists   Some recent data might be hidden    Patient Care Team: Kathrine Haddock, NP as PCP - General (Nurse Practitioner) [name], MD (General Surgery) Laneta Simmers as Physician Assistant (Urology) Isaias Cowman, MD as Consulting Physician (Cardiology) Thelma Comp, [facility] (Optometry)  Assessment:   This is a routine wellness examination for [place].  Exercise Activities and Dietary recommendations Current Exercise Habits: The patient does not participate in regular exercise at present, Exercise limited by: neurologic condition(s)(dementia )  Goals    . DIET - INCREASE WATER INTAKE     Recommend drinking at least 6-8 glasses of water a day        Fall Risk Fall Risk  07/30/2017 07/28/2016 11/08/2015 10/27/2014  Falls in the past year? No No No No   Is the patient's home free of loose throw rugs in walkways, pet beds, electrical cords, etc?   yes      Grab bars in the bathroom? yes      Handrails on the stairs?   yes      Adequate lighting?   yes  Timed Get Up and Go performed: Completed in 8 seconds with no use of assistive devices, steady gait. No intervention needed at this time.   Depression Screen PHQ 2/9 Scores 07/30/2017 07/06/2016 11/08/2015 06/30/2015  PHQ - 2 Score 0 0 0 2  PHQ- 9 Score - - 0 4     Cognitive Function MMSE - Mini Mental State Exam 07/17/2016  Orientation to time 1  Orientation to Place 5  Registration 3  Attention/ Calculation 5  Recall 1  Language- name 2 objects 2  Language- repeat 1  Language- follow 3 step command 3  Language- read & follow direction 1  Write a sentence 1  Copy design 1  Total score 24   Montreal Cognitive  Assessment  07/28/2016  Visuospatial/ Executive (0/5) 4  Naming (0/3) 3  Attention: Read list of digits (0/2) 2  Attention: Read list of letters (0/1) 0  Attention: Serial 7 subtraction starting at 100 (0/3) 0  Language: Repeat phrase (0/2) 2  Language : Fluency (0/1) 0  Abstraction (0/2) 2  Delayed Recall (0/5) 0  Orientation (0/6) 4  Total 17   6CIT Screen 07/30/2017 07/06/2016  What Year? 4 points 0 points  What month? 3 points 0 points  What time? 3 points 0 points  Count back from 20 4 points 0 points  Months in reverse 4 points 4 points  Repeat phrase 10 points 10 points  Total Score 28 14     There is no immunization history on file for this patient.  Qualifies for Shingles Vaccine? Yes, discussed shingrix vaccine   Screening Tests Health Maintenance  Topic Date Due  . INFLUENZA VACCINE  10/01/2017 (Originally 08/23/2017)  . TETANUS/TDAP  01/29/2018 (Originally 10/26/2015)  . PNA vac Low Risk Adult (1 of 2 - PCV13) 01/29/2018 (Originally 07/28/2007)  . COLONOSCOPY  03/22/2026  . DEXA SCAN  Completed    Cancer Screenings: Lung: Low Dose CT Chest recommended if Age 23-80 years, 30 pack-year currently smoking OR have quit w/in 15years. Patient does not qualify. Breast:  Up to date on Mammogram? Yes   Up to date of Bone Density/Dexa? Yes 10/13/2009 Colorectal: completed 03/22/2016  Additional Screenings:  Hepatitis C Screening: not indicated     Plan:    I have personally reviewed and addressed the Medicare Annual Wellness questionnaire and have noted the following in the patient's chart:  A. Medical and social history B. Use of alcohol, tobacco or illicit drugs  C. Current medications and supplements D. Functional ability and status E.  Nutritional status F.  Physical activity G. Advance directives H. List of other physicians I.  Hospitalizations, surgeries, and ER visits in  previous 12 months J.  [place] such as hearing and vision if needed, cognitive  and depression L. Referrals and appointments    In addition, I have reviewed and discussed with patient certain preventive protocols, quality metrics, and best practice recommendations. A written personalized care plan for preventive services as well as general preventive health recommendations were provided to patient.   Signed,  Tyler Aas, LPN Nurse Health Advisor   Nurse Notes: patient states her right knee has been hurting more frequently.

## 2017-07-30 NOTE — Patient Instructions (Addendum)
Christine Burch , Thank you for taking time to come for your Medicare Wellness Visit. I appreciate your ongoing commitment to your health goals. Please review the following plan we discussed and let me know if I can assist you in the future.   Screening recommendations/referrals: Colonoscopy: completed 03/22/2016 Mammogram: up to date, no longer required Bone Density: completed  Recommended yearly ophthalmology/optometry visit for glaucoma screening and checkup Recommended yearly dental visit for hygiene and checkup  Vaccinations: Influenza vaccine: due 09/2017  Pneumococcal vaccine: declined  Tdap vaccine: declined Shingles vaccine: shingrix eligible, check with your insurance company for coverage     Advanced directives: Please bring a copy of your health care power of attorney and living will to the office at your convenience.  Conditions/risks identified: Recommend drinking at least 6-8 glasses of water a day   Next appointment: Follow up in one year for your annual wellness exam.    Preventive Care 65 Years and Older, Female Preventive care refers to lifestyle choices and visits with your health care provider that can promote health and wellness. What does preventive care include?  A yearly physical exam. This is also called an annual well check.  Dental exams once or twice a year.  Routine eye exams. Ask your health care provider how often you should have your eyes checked.  Personal lifestyle choices, including:  Daily care of your teeth and gums.  Regular physical activity.  Eating a healthy diet.  Avoiding tobacco and drug use.  Limiting alcohol use.  Practicing safe sex.  Taking low-dose aspirin every day.  Taking vitamin and mineral supplements as recommended by your health care provider. What happens during an annual well check? The services and screenings done by your health care provider during your annual well check will depend on your age, overall health,  lifestyle risk factors, and family history of disease. Counseling  Your health care provider may ask you questions about your:  Alcohol use.  Tobacco use.  Drug use.  Emotional well-being.  Home and relationship well-being.  Sexual activity.  Eating habits.  History of falls.  Memory and ability to understand (cognition).  Work and work Statistician.  Reproductive health. Screening  You may have the following tests or measurements:  Height, weight, and BMI.  Blood pressure.  Lipid and cholesterol levels. These may be checked every 5 years, or more frequently if you are over 21 years old.  Skin check.  Lung cancer screening. You may have this screening every year starting at age 1 if you have a 30-pack-year history of smoking and currently smoke or have quit within the past 15 years.  Fecal occult blood test (FOBT) of the stool. You may have this test every year starting at age 42.  Flexible sigmoidoscopy or colonoscopy. You may have a sigmoidoscopy every 5 years or a colonoscopy every 10 years starting at age 63.  Hepatitis C blood test.  Hepatitis B blood test.  Sexually transmitted disease (STD) testing.  Diabetes screening. This is done by checking your blood sugar (glucose) after you have not eaten for a while (fasting). You may have this done every 1-3 years.  Bone density scan. This is done to screen for osteoporosis. You may have this done starting at age 3.  Mammogram. This may be done every 1-2 years. Talk to your health care provider about how often you should have regular mammograms. Talk with your health care provider about your test results, treatment options, and if necessary, the need  for more tests. Vaccines  Your health care provider may recommend certain vaccines, such as:  Influenza vaccine. This is recommended every year.  Tetanus, diphtheria, and acellular pertussis (Tdap, Td) vaccine. You may need a Td booster every 10 years.  Zoster  vaccine. You may need this after age 73.  Pneumococcal 13-valent conjugate (PCV13) vaccine. One dose is recommended after age 37.  Pneumococcal polysaccharide (PPSV23) vaccine. One dose is recommended after age 52. Talk to your health care provider about which screenings and vaccines you need and how often you need them. This information is not intended to replace advice given to you by your health care provider. Make sure you discuss any questions you have with your health care provider. Document Released: 02/05/2015 Document Revised: 09/29/2015 Document Reviewed: 11/10/2014 Elsevier Interactive Patient Education  2017 [place] Prevention in the Home Falls can cause injuries. They can happen to people of all ages. There are many things you can do to make your home safe and to help prevent falls. What can I do on the outside of my home?  Regularly fix the edges of walkways and driveways and fix any cracks.  Remove anything that might make you trip as you walk through a door, such as a raised step or threshold.  Trim any bushes or trees on the path to your home.  Use bright outdoor lighting.  Clear any walking paths of anything that might make someone trip, such as rocks or tools.  Regularly check to see if handrails are loose or broken. Make sure that both sides of any steps have handrails.  Any raised decks and porches should have guardrails on the edges.  Have any leaves, snow, or ice cleared regularly.  Use sand or salt on walking paths during winter.  Clean up any spills in your garage right away. This includes oil or grease spills. What can I do in the bathroom?  Use night lights.  Install grab bars by the toilet and in the tub and shower. Do not use towel bars as grab bars.  Use non-skid mats or decals in the tub or shower.  If you need to sit down in the shower, use a plastic, non-slip stool.  Keep the floor dry. Clean up any water that spills on the  floor as soon as it happens.  Remove soap buildup in the tub or shower regularly.  Attach bath mats securely with double-sided non-slip rug tape.  Do not have throw rugs and other things on the floor that can make you trip. What can I do in the bedroom?  Use night lights.  Make sure that you have a light by your bed that is easy to reach.  Do not use any sheets or blankets that are too big for your bed. They should not hang down onto the floor.  Have a firm chair that has side arms. You can use this for support while you get dressed.  Do not have throw rugs and other things on the floor that can make you trip. What can I do in the kitchen?  Clean up any spills right away.  Avoid walking on wet floors.  Keep items that you use a lot in easy-to-reach places.  If you need to reach something above you, use a strong step stool that has a grab bar.  Keep electrical cords out of the way.  Do not use floor polish or wax that makes floors slippery. If you must use wax, use  non-skid floor wax.  Do not have throw rugs and other things on the floor that can make you trip. What can I do with my stairs?  Do not leave any items on the stairs.  Make sure that there are handrails on both sides of the stairs and use them. Fix handrails that are broken or loose. Make sure that handrails are as long as the stairways.  Check any carpeting to make sure that it is firmly attached to the stairs. Fix any carpet that is loose or worn.  Avoid having throw rugs at the top or bottom of the stairs. If you do have throw rugs, attach them to the floor with carpet tape.  Make sure that you have a light switch at the top of the stairs and the bottom of the stairs. If you do not have them, ask someone to add them for you. What else can I do to help prevent falls?  Wear shoes that:  Do not have high heels.  Have rubber bottoms.  Are comfortable and fit you well.  Are closed at the toe. Do not wear  sandals.  If you use a stepladder:  Make sure that it is fully opened. Do not climb a closed stepladder.  Make sure that both sides of the stepladder are locked into place.  Ask someone to hold it for you, if possible.  Clearly mark and make sure that you can see:  Any grab bars or handrails.  First and last steps.  Where the edge of each step is.  Use tools that help you move around (mobility aids) if they are needed. These include:  Canes.  Walkers.  Scooters.  Crutches.  Turn on the lights when you go into a dark area. Replace any light bulbs as soon as they burn out.  Set up your furniture so you have a clear path. Avoid moving your furniture around.  If any of your floors are uneven, fix them.  If there are any pets around you, be aware of where they are.  Review your medicines with your doctor. Some medicines can make you feel dizzy. This can increase your chance of falling. Ask your doctor what other things that you can do to help prevent falls. This information is not intended to replace advice given to you by your health care provider. Make sure you discuss any questions you have with your health care provider. Document Released: 11/05/2008 Document Revised: 06/17/2015 Document Reviewed: 02/13/2014 Elsevier Interactive Patient Education  2017 [employer].

## 2017-07-30 NOTE — Assessment & Plan Note (Addendum)
Progressive dementia.  Discussed potential long term needs.  She has been to see neurology and family elects not to return

## 2017-07-30 NOTE — Progress Notes (Signed)
BP 102/60   Pulse (!) 59   Temp 97.6 F (36.4 C) (Temporal)   Ht 5\' 2"  (1.575 m)   Wt 114 lb 11.2 oz (52 kg)   LMP  (LMP Unknown)   SpO2 96%   BMI 20.98 kg/m    Subjective:    Patient ID: Christine Burch, female    DOB: 02/03/1940, 77 y.o.   MRN: [id number]  HPI: Christine Burch is a 77 y.o. female  Chief Complaint  Patient presents with  . Dementia  . Hypertension   Hypertension Using medications without difficulty Average home BPs Not checking  No problems or lightheadedness No chest pain with exertion or shortness of breath No Edema  Dementia Husband states there is a mild decline.  Daughter states major since this time in the past year.  Needs full care at this time with helping dressing, bathing, and going to the bathroom.    Relevant past medical, surgical, family and social history reviewed and updated as indicated. Interim medical history since our last visit reviewed. Allergies and medications reviewed and updated.  Review of Systems  Per HPI unless specifically indicated above     Objective:    BP 102/60   Pulse (!) 59   Temp 97.6 F (36.4 C) (Temporal)   Ht 5\' 2"  (1.575 m)   Wt 114 lb 11.2 oz (52 kg)   LMP  (LMP Unknown)   SpO2 96%   BMI 20.98 kg/m   Wt Readings from Last 3 Encounters:  07/30/17 114 lb 11.2 oz (52 kg)  07/30/17 114 lb 11.2 oz (52 kg)  04/19/17 109 lb (49.4 kg)    Physical Exam  Constitutional: She appears well-developed and well-nourished. No distress.  HENT:  Head: Normocephalic and atraumatic.  Eyes: Conjunctivae and lids are normal. Right eye exhibits no discharge. Left eye exhibits no discharge. No scleral icterus.  Neck: Normal range of motion. Neck supple. No JVD present. Carotid bruit is not present.  Cardiovascular: Normal rate, regular rhythm and normal heart sounds.  Pulmonary/Chest: Effort normal and breath sounds normal.  Abdominal: Normal appearance. There is no splenomegaly or hepatomegaly.  Musculoskeletal:  Normal range of motion.  Skin: Skin is warm, dry and intact. No rash noted. No pallor.  Psychiatric: She has a normal mood and affect. She exhibits abnormal recent memory.    Results for orders placed or performed in visit on 04/19/17  Lipid Panel w/o Chol/HDL Ratio  Result Value Ref Range   Cholesterol, Total 211 (H) 100 - 199 mg/dL   Triglycerides 145 0 - 149 mg/dL   HDL 50 >39 mg/dL   VLDL Cholesterol Cal 29 5 - 40 mg/dL   LDL Calculated 132 (H) 0 - 99 mg/dL      Assessment & Plan:   Problem List Items Addressed This Visit      Unprioritized   Dementia    Progressive dementia.  Discussed potential long term needs.  She has been to see neurology and family elects not to return      Hypertension    Low normal BP.  Will stop Losartan        Other Visit Diagnoses    Need for pneumococcal vaccination    -  Primary   Relevant Orders   Pneumococcal conjugate vaccine 13-valent IM (Completed)      Greater than 50% of this  25 minute visit was spent in counseling/coordination of care regarding dementia.    Follow up plan: Return in about 6  months (around 01/30/2018) for with [name].

## 2017-07-30 NOTE — Patient Instructions (Addendum)
[facility]: Look at adult daycare Ask about care coordinators.    [email or web address]-[email or web address] Conjugate Vaccine (PCV13) What You Need to Know 1. Why get vaccinated? Vaccination can protect both children and adults from pneumococcal disease. Pneumococcal disease is caused by bacteria that can spread from person to person through close contact. It can cause ear infections, and it can also lead to more serious infections of the:  Lungs (pneumonia),  Blood (bacteremia), and  Covering of the brain and spinal cord (meningitis).  Pneumococcal pneumonia is most common among adults. Pneumococcal meningitis can cause deafness and brain damage, and it kills about 1 child in 10 who get it. Anyone can get pneumococcal disease, but children under 45 years of age and adults 40 years and older, people with certain medical conditions, and cigarette smokers are at the highest risk. Before there was a vaccine, the [place] saw:  more than 700 cases of meningitis,  about 13,000 blood infections,  about 5 million ear infections, and  about 200 deaths  in children under 5 each year from pneumococcal disease. Since vaccine became available, severe pneumococcal disease in these children has fallen by 88%. About 18,000 older adults die of pneumococcal disease each year in the [place]. Treatment of pneumococcal infections with penicillin and other drugs is not as effective as it used to be, because some strains of the disease have become resistant to these drugs. This makes prevention of the disease, through vaccination, even more important. 2. PCV13 vaccine Pneumococcal conjugate vaccine (called PCV13) protects against 13 types of pneumococcal bacteria. PCV13 is routinely given to children at 2, 4, 6, and 23-97 months of age. It is also recommended for children and adults 24 to 77 years of age with certain health conditions, and for all adults 42 years of age and older.  Your doctor can give you details. 3. Some people should not get this vaccine Anyone who has ever had a life-threatening allergic reaction to a dose of this vaccine, to an earlier pneumococcal vaccine called PCV7, or to any vaccine containing diphtheria toxoid (for example, DTaP), should not get PCV13. Anyone with a severe allergy to any component of PCV13 should not get the vaccine. Tell your doctor if the person being vaccinated has any severe allergies. If the person scheduled for vaccination is not feeling well, your healthcare provider might decide to reschedule the shot on another day. 4. Risks of a vaccine reaction With any medicine, including vaccines, there is a chance of reactions. These are usually mild and go away on their own, but serious reactions are also possible. Problems reported following PCV13 varied by age and dose in the series. The most common problems reported among children were:  About half became drowsy after the shot, had a temporary loss of appetite, or had redness or tenderness where the shot was given.  About 1 out of 3 had swelling where the shot was given.  About 1 out of 3 had a mild fever, and about 1 in 20 had a fever over 102.72F.  Up to about 8 out of 10 became fussy or irritable.  Adults have reported pain, redness, and swelling where the shot was given; also mild fever, fatigue, headache, chills, or muscle pain. Young children who get PCV13 along with inactivated flu vaccine at the same time may be at increased risk for seizures caused by fever. Ask your doctor for more information. Problems that could happen after any vaccine:  People sometimes faint after a  medical procedure, including vaccination. Sitting or lying down for about 15 minutes can help prevent fainting, and injuries caused by a fall. Tell your doctor if you feel dizzy, or have vision changes or ringing in the ears.  Some older children and adults get severe pain in the shoulder and have  difficulty moving the arm where a shot was given. This happens very rarely.  Any medication can cause a severe allergic reaction. Such reactions from a vaccine are very rare, estimated at about 1 in a million doses, and would happen within a few minutes to a few hours after the vaccination. As with any medicine, there is a very small chance of a vaccine causing a serious injury or death. The safety of vaccines is always being monitored. For more information, visit: http://www.aguilar.org/ 5. What if there is a serious reaction? What should I look for? Look for anything that concerns you, such as signs of a severe allergic reaction, very high fever, or unusual behavior. Signs of a severe allergic reaction can include hives, swelling of the face and throat, difficulty breathing, a fast heartbeat, dizziness, and weakness-usually within a few minutes to a few hours after the vaccination. What should I do?  If you think it is a severe allergic reaction or other emergency that can't wait, call 9-1-1 or get the person to the nearest hospital. Otherwise, call your doctor.  Reactions should be reported to the Vaccine Adverse Event Reporting System (VAERS). Your doctor should file this report, or you can do it yourself through the VAERS web site at www.vaers.SamedayNews.es, or by calling (206)515-6886. ? VAERS does not give medical advice. 6. The National Vaccine Injury Compensation Program The [employer] Vaccine Injury Compensation Program (VICP) is a federal program that was created to compensate people who may have been injured by certain vaccines. Persons who believe they may have been injured by a vaccine can learn about the program and about filing a claim by calling (989)732-9344 or visiting the [employer] website at GoldCloset.com.ee. There is a time limit to file a claim for compensation. 7. How can I learn more?  Ask your healthcare provider. He or she can give you the vaccine package insert  or suggest other sources of information.  Call your local or state health department.  Contact the Centers for Disease Control and Prevention (CDC): ? Call 929-405-2735 (1-800-CDC-INFO) or ? Visit CDC's website at http://hunter.com/ Vaccine Information Statement, PCV13 Vaccine (11/27/2013) This information is not intended to replace advice given to you by your health care provider. Make sure you discuss any questions you have with your health care provider. Document Released: 11/06/2005 Document Revised: 09/30/2015 Document Reviewed: 09/30/2015 Elsevier Interactive Patient Education  2017 [employer].

## 2017-08-06 ENCOUNTER — Telehealth: Payer: Self-pay | Admitting: Unknown Physician Specialty

## 2017-08-06 NOTE — Telephone Encounter (Signed)
Copied from [place] [id number]. Topic: Quick Communication - See Telephone Encounter >> Aug 06, 2017 11:09 AM Vernona Rieger wrote: CRM for notification. See Telephone encounter for: 08/06/17.  Patient's husband [name] ) called and said he thinks she is having a reaction to her pneumonia shot she had last week. It is red and broken out. Please call back @ [phone]

## 2017-08-06 NOTE — Telephone Encounter (Signed)
Message relayed to patient. Verbalized understanding and denied questions.   

## 2017-08-06 NOTE — Telephone Encounter (Signed)
Called and spoke with patient. Her arm is red, sore, warm to the touch. Redness is roughly 3 in x 3 in. Husband said that redness has went down some since this morning, but warmth and soreness have not resolved any. Please advise.

## 2017-08-06 NOTE — Telephone Encounter (Signed)
Thanks, since redness has improved, I would use Tylenol and apply ice pack to the site (with protective covering) every 2 hours as needed.

## 2017-08-06 NOTE — Telephone Encounter (Signed)
Can someone call and discuss symptoms with her?

## 2017-08-17 ENCOUNTER — Other Ambulatory Visit: Payer: Self-pay

## 2017-08-17 DIAGNOSIS — I6522 Occlusion and stenosis of left carotid artery: Secondary | ICD-10-CM

## 2017-08-28 DIAGNOSIS — Z9889 Other specified postprocedural states: Secondary | ICD-10-CM | POA: Diagnosis not present

## 2017-08-28 DIAGNOSIS — I10 Essential (primary) hypertension: Secondary | ICD-10-CM | POA: Diagnosis not present

## 2017-08-28 DIAGNOSIS — F0280 Dementia in other diseases classified elsewhere without behavioral disturbance: Secondary | ICD-10-CM | POA: Diagnosis not present

## 2017-08-28 DIAGNOSIS — I679 Cerebrovascular disease, unspecified: Secondary | ICD-10-CM | POA: Diagnosis not present

## 2017-08-28 DIAGNOSIS — Z955 Presence of coronary angioplasty implant and graft: Secondary | ICD-10-CM | POA: Diagnosis not present

## 2017-08-28 DIAGNOSIS — G988 Other disorders of nervous system: Secondary | ICD-10-CM | POA: Diagnosis not present

## 2017-08-28 DIAGNOSIS — E7800 Pure hypercholesterolemia, unspecified: Secondary | ICD-10-CM | POA: Diagnosis not present

## 2017-09-07 ENCOUNTER — Encounter (INDEPENDENT_AMBULATORY_CARE_PROVIDER_SITE_OTHER): Payer: Medicare Other | Admitting: Vascular Surgery

## 2017-09-07 DIAGNOSIS — M7021 Olecranon bursitis, right elbow: Secondary | ICD-10-CM | POA: Diagnosis not present

## 2017-09-20 ENCOUNTER — Other Ambulatory Visit: Payer: Self-pay | Admitting: Family Medicine

## 2017-09-21 ENCOUNTER — Encounter (INDEPENDENT_AMBULATORY_CARE_PROVIDER_SITE_OTHER): Payer: Self-pay | Admitting: Vascular Surgery

## 2017-09-21 ENCOUNTER — Ambulatory Visit (INDEPENDENT_AMBULATORY_CARE_PROVIDER_SITE_OTHER): Payer: Medicare Other | Admitting: Vascular Surgery

## 2017-09-21 VITALS — BP 130/70 | HR 83 | Resp 12 | Ht 63.5 in | Wt 114.0 lb

## 2017-09-21 DIAGNOSIS — I10 Essential (primary) hypertension: Secondary | ICD-10-CM | POA: Diagnosis not present

## 2017-09-21 DIAGNOSIS — I2510 Atherosclerotic heart disease of native coronary artery without angina pectoris: Secondary | ICD-10-CM | POA: Diagnosis not present

## 2017-09-21 DIAGNOSIS — I6523 Occlusion and stenosis of bilateral carotid arteries: Secondary | ICD-10-CM

## 2017-09-21 NOTE — Assessment & Plan Note (Signed)
blood pressure control important in reducing the progression of atherosclerotic disease. On appropriate oral medications.  

## 2017-09-21 NOTE — Assessment & Plan Note (Signed)
The patient has a history of a right carotid endarterectomy some 7 to 8 years ago by a surgeon no longer in the medical community.  The general surgeon who is following her has retired.  She has not had a carotid ultrasound in about a year.  She had mild left carotid disease at that time.  She has some dementia but no focal neurologic symptoms.  We will obtain a carotid duplex in the near future at her convenience.  She should continue her antiplatelet therapy.  I will see her back following her carotid duplex

## 2017-09-21 NOTE — Assessment & Plan Note (Signed)
S/p stent placement.  Follows with cardiology.

## 2017-09-21 NOTE — Progress Notes (Signed)
Patient ID: Christine Burch, female   DOB: 11-17-1940, 77 y.o.   MRN: [id number]  Chief Complaint  Patient presents with  . New Patient (Initial Visit)    Carotid Stenosis    HPI Christine Burch is a 77 y.o. female.  I am asked to see the patient by Dr. Merrilyn Puma for evaluation of carotid stenosis.  The patient reports having had a carotid endarterectomy some 8 to 10 years ago by a surgeon no longer in the medical community.  A general surgeon who has retired had apparently followed her carotid arteries for some time.  It is not clear when her last ultrasound was.  The last report I see is from about a year ago where she had mild left carotid stenosis.  I did not see an assessment of her right carotid endarterectomy site.  She is not a great historian and her husband provides much of the history and he thinks this is the last ultrasound that was done.  No recent focal neurologic symptoms. Specifically, the patient denies amaurosis fugax, speech or swallowing difficulties, or arm or leg weakness or numbness.  She does have memory issues and has been diagnosed with dementia   Past Medical History:  Diagnosis Date  . Acid reflux   . Colon polyp 2012, 2014  . 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 Enlargement of lymph nodes 2013  . Gallstones   . High cholesterol   . Hypertension 2013  . Memory changes 2018  . Tumors 2006    Past Surgical History:  Procedure Laterality Date  . ABDOMINAL HYSTERECTOMY  1980  . CARDIAC SURGERY  2010   stint  . CAROTID ENDARTERECTOMY Right 2011  . CHOLECYSTECTOMY N/A 04/19/2015   Procedure: LAPAROSCOPIC CHOLECYSTECTOMY;  Surgeon: [name], MD;  Location: [facility] ORS;  Service: General;  Laterality: N/A;  . COLONOSCOPY  [id number]   Dr.Hashmi  . COLONOSCOPY WITH PROPOFOL N/A 03/31/2015   Procedure: COLONOSCOPY WITH PROPOFOL;  Surgeon: [name], MD;  Location: [facility];  Service: Endoscopy;  Laterality: N/A;  . COLONOSCOPY WITH  PROPOFOL N/A 03/22/2016   Procedure: COLONOSCOPY WITH PROPOFOL;  Surgeon: [name], MD;  Location: [facility];  Service: Endoscopy;  Laterality: N/A;  . CORONARY STENT PLACEMENT    . EXCISION MASS ABDOMINAL N/A 04/19/2015   (GIST): EXCISION MASS ABDOMINAL/ EXCISION GASTRIC MASS;  Surgeon: [name], MD;  Location: [facility] ORS;  Service: General;  Laterality: N/A;  . LAPAROSCOPIC RIGHT COLECTOMY Right 04/19/2015   Procedure: LAPAROSCOPIC RIGHT COLECTOMY;  Surgeon: [name], MD;  Location: [facility] OR[facility];  Service: General;  Laterality: Right;  . UPPER GI ENDOSCOPY  2012, 2014    Family History  Problem Relation Age of Onset  . Heart disease Mother   . Heart disease Father   . Arthritis Sister   . Heart disease Sister   . Heart disease Maternal Grandmother   . Heart disease Maternal Grandfather   . Heart disease Paternal Grandmother   . Heart disease Paternal Grandfather   . Breast cancer Neg Hx      Social History Social History   Tobacco Use  . Smoking status: Never Smoker  . Smokeless tobacco: Never Used  Substance Use Topics  . Alcohol use: No    Alcohol/week: 0.0 standard drinks  . Drug use: No     No Known Allergies  Current Outpatient Medications  Medication Sig Dispense Refill  . aspirin 81 MG tablet Take by  mouth. 2 tablets daily    . Cholecalciferol (VITAMIN D3) 2000 units TABS Take 2,000 Units by mouth daily.    Marland Kitchen ezetimibe (ZETIA) 10 MG tablet Take 1 tablet (10 mg total) by mouth daily. 90 tablet 3  . mirtazapine (REMERON) 15 MG tablet Take 1 tablet (15 mg total) by mouth at bedtime. 90 tablet 3  . ranitidine (ZANTAC) 150 MG tablet TAKE 1 TABLET BY MOUTH TWICE DAILY 180 tablet 3  . clopidogrel (PLAVIX) 75 MG tablet Take 75 mg by mouth daily.    . naproxen (NAPROSYN) 500 MG tablet   0  . omega-3 fish oil (MAXEPA) 1000 MG CAPS capsule Take by mouth.     No current facility-administered medications for this visit.        REVIEW OF SYSTEMS (Negative unless checked)  Constitutional: [] Weight loss  [] Fever  [] Chills Cardiac: [] Chest Burch   [] Chest pressure   [] Palpitations   [] Shortness of breath when laying flat   [] Shortness of breath at rest   [] Shortness of breath with exertion. Vascular:  [] Burch in legs with walking   [] Burch in legs at rest   [] Burch in legs when laying flat   [] Claudication   [] Burch in feet when walking  [] Burch in feet at rest  [] Burch in feet when laying flat   [] History of DVT   [] Phlebitis   [] Swelling in legs   [] Varicose veins   [] Non-healing ulcers Pulmonary:   [] Uses home oxygen   [] Productive cough   [] Hemoptysis   [] Wheeze  [] COPD   [] Asthma Neurologic:  [x] Dizziness  [] Blackouts   [] Seizures   [] History of stroke   [] History of TIA  [] Aphasia   [] Temporary blindness   [] Dysphagia   [] Weakness or numbness in arms   [] Weakness or numbness in legs X positive for dementia Musculoskeletal:  [] Arthritis   [] Joint swelling   [] Joint Burch   [] Low back Burch Hematologic:  [] Easy bruising  [] Easy bleeding   [] Hypercoagulable state   [] Anemic  [] Hepatitis Gastrointestinal:  [] Blood in stool   [] Vomiting blood  [x] Gastroesophageal reflux/heartburn   [] Abdominal Burch Genitourinary:  [] Chronic kidney disease   [] Difficult urination  [] Frequent urination  [] Burning with urination   [] Hematuria Skin:  [] Rashes   [] Ulcers   [] Wounds Psychological:  [x] History of anxiety   []  History of major depression.    Physical Exam BP 130/70 (BP Location: Right Arm, Patient Position: Sitting)   Pulse 83   Resp 12   Ht 5' 3.5" (1.613 m)   Wt 114 lb (51.7 kg)   LMP  (LMP Unknown)   BMI 19.88 kg/m  Gen:  WD/WN, NAD Head: [place]/AT, No temporalis wasting.  Ear/Nose/Throat: Hearing grossly intact, nares w/o erythema or drainage, oropharynx w/o Erythema/Exudate Eyes: Conjunctiva clear, sclera non-icteric  Neck: trachea midline.  No bruit  Pulmonary:  Good air movement, clear to auscultation bilaterally.   Cardiac: RRR, no JVD Vascular:  Vessel Right Left  Radial Palpable Palpable                                   Gastrointestinal: soft, non-tender/non-distended. Musculoskeletal: M/S 5/5 throughout.  Extremities without ischemic changes.  No deformity or atrophy. No edema. Neurologic: Sensation grossly intact in extremities.  Symmetrical.  Speech is infrequent. Motor exam as listed above. Psychiatric: Judgment and insight appear fair.  Not a great historian Dermatologic: No rashes or ulcers noted.  No cellulitis or open wounds.  Radiology No results found.  Labs No results found for this or any previous visit (from the past 2160 hour(s)).  Assessment/Plan:  Hypertension blood pressure control important in reducing the progression of atherosclerotic disease. On appropriate oral medications.   CAD (coronary artery disease) S/p stent placement.  Follows with cardiology.  Carotid stenosis The patient has a history of a right carotid endarterectomy some 7 to 8 years ago by a surgeon no longer in the medical community.  The general surgeon who is following her has retired.  She has not had a carotid ultrasound in about a year.  She had mild left carotid disease at that time.  She has some dementia but no focal neurologic symptoms.  We will obtain a carotid duplex in the near future at her convenience.  She should continue her antiplatelet therapy.  I will see her back following her carotid duplex      Christine Burch 09/21/2017, 2:44 PM   This note was created with Dragon medical transcription system.  Any errors from dictation are unintentional.

## 2017-09-21 NOTE — Patient Instructions (Signed)
Carotid Artery Disease The carotid arteries are arteries on both sides of the neck. They carry blood to the brain. Carotid artery disease is when the arteries get smaller (narrow) or get blocked. If these arteries get smaller or get blocked, you are more likely to have a stroke or warning stroke (transient ischemic attack). Follow these instructions at home:  Take medicines as told by your doctor. Make sure you understand all your medicine instructions. Do not stop your medicines without talking to your doctor first.  Follow your doctor's diet instructions. It is important to eat a healthy diet that includes plenty of: ? Fresh fruits. ? Vegetables. ? Lean meats.  Avoid: ? High-fat foods. ? High-sodium foods. ? Foods that are fried, overly processed, or have poor nutritional value.  Stay a healthy weight.  Stay active. Get at least 30 minutes of activity every day.  Do not smoke.  Limit alcohol use to: ? No more than 2 drinks a day for men. ? No more than 1 drink a day for women who are not pregnant.  Do not use illegal drugs.  Keep all doctor visits as told. Get help right away if:  You have sudden weakness or loss of feeling (numbness) on one side of the body, such as the face, arm, or leg.  You have sudden confusion.  You have trouble speaking (aphasia) or understanding.  You have sudden trouble seeing out of one or both eyes.  You have sudden trouble walking.  You have dizziness or feel like you might pass out (faint).  You have a loss of balance or your movements are not steady (uncoordinated).  You have a sudden, severe headache with no known cause.  You have trouble swallowing (dysphagia). Call your local emergency services (911 in [place] Do notdrive yourself to the clinic or hospital. This information is not intended to replace advice given to you by your health care provider. Make sure you discuss any questions you have with your health care  provider. Document Released: 12/27/2011 Document Revised: 06/17/2015 Document Reviewed: 07/10/2012 Elsevier Interactive Patient Education  2018 [employer]  

## 2017-10-22 DIAGNOSIS — H1132 Conjunctival hemorrhage, left eye: Secondary | ICD-10-CM | POA: Diagnosis not present

## 2017-10-22 NOTE — Progress Notes (Signed)
Cardiology Office Note  Date:  10/24/2017   ID:  Christine Burch, DOB May 24, 1940, MRN [id number]  PCP:  Kathrine Haddock, NP   Chief Complaint  Patient presents with  . OTHER    Former [facility] pt est. care no complaints today. Meds reviewed verbally with pt.    HPI:  Ms. Christine Burch is a 77 y.o.female patient with past medical history of coronary disease BMS mid left circumflex, proximal RCA, 05/28/2008 Hypertension Hyperlipidemia PAD, carotid disease/stenosis, s/p Right CEA Dementia Who presents to establish care for her coronary disease, PAD  She presents today with her husband Worsening dementia over the past 3 years She was relatively nonverbal on today's visit but alert, no distress  He reports she is able to ambulate without assistance, does not need cane or walkers Actually moves very fast No falls  Dementia seem to start after she had surgery for polyps several years ago, Husband thinks anesthesia may have triggered her decline Weight has been stable  Previous testing including Stress echocardiogram 03/10/2014  revealed normal left ventricular function without evidence for ischemia.  Lab work reviewed LDL cholesterol is 153 on 01/30/17  previously on Crestor, which was discontinued by her husband due to the patient's mental status declining while on it.  This may have been suggested by patient's daughter  Only giving half-dose of Zetia  Weight has been slowly increasing over the past 2 years was previously 95 pounds now 118 pounds Eating well  She is able to accompany him everywhere they go Husband works part-time funeral home  EKG personally reviewed by myself on todays visit Shows normal sinus rhythm with rate 67 bpm no significant ST or T wave changes   PMH:   has a past medical history of Acid reflux, Colon polyp (2012, 2014), Colon polyp (04/19/2015), Coronary artery disease, Enlargement of lymph nodes (2013), Gallstones, High cholesterol, Hypertension (2013),  Memory changes (2018), and Tumors (2006).  PSH:    Past Surgical History:  Procedure Laterality Date  . ABDOMINAL HYSTERECTOMY  1980  . CARDIAC CATHETERIZATION     x2 stents ;[facility]  . CARDIAC SURGERY  2010   stint  . CAROTID ENDARTERECTOMY Right 2011  . CHOLECYSTECTOMY N/A 04/19/2015   Procedure: LAPAROSCOPIC CHOLECYSTECTOMY;  Surgeon: Christine Lye, MD;  Location: [facility] ORS;  Service: General;  Laterality: N/A;  . COLONOSCOPY  [id number]   Dr.Hashmi  . COLONOSCOPY WITH PROPOFOL N/A 03/31/2015   Procedure: COLONOSCOPY WITH PROPOFOL;  Surgeon: Christine Lye, MD;  Location: [facility];  Service: Endoscopy;  Laterality: N/A;  . COLONOSCOPY WITH PROPOFOL N/A 03/22/2016   Procedure: COLONOSCOPY WITH PROPOFOL;  Surgeon: Christine Lye, MD;  Location: [facility];  Service: Endoscopy;  Laterality: N/A;  . CORONARY STENT PLACEMENT    . EXCISION MASS ABDOMINAL N/A 04/19/2015   (GIST): EXCISION MASS ABDOMINAL/ EXCISION GASTRIC MASS;  Surgeon: Christine Lye, MD;  Location: [facility];  Service: General;  Laterality: N/A;  . LAPAROSCOPIC RIGHT COLECTOMY Right 04/19/2015   Procedure: LAPAROSCOPIC RIGHT COLECTOMY;  Surgeon: Christine Lye, MD;  Location: [facility];  Service: General;  Laterality: Right;  . UPPER GI ENDOSCOPY  2012, 2014    Current Outpatient Medications  Medication Sig Dispense Refill  . aspirin 81 MG tablet Take 81 mg by mouth daily.     . Cholecalciferol (VITAMIN D3) 2000 units TABS Take 2,000 Units by mouth daily.    Marland Kitchen ezetimibe (ZETIA) 10 MG tablet Take 1 tablet (10 mg total) by mouth daily. [place]  tablet 3  . mirtazapine (REMERON) 15 MG tablet Take 1 tablet (15 mg total) by mouth at bedtime. 90 tablet 3  . ranitidine (ZANTAC) 150 MG tablet TAKE 1 TABLET BY MOUTH TWICE DAILY 180 tablet 3   No current facility-administered medications for this visit.      Allergies:   Patient has no known allergies.   Social History:  The patient  reports  that she has never smoked. She has never used smokeless tobacco. She reports that she does not drink alcohol or use drugs.   Family History:   family history includes Arthritis in her sister; Heart attack in her mother; Heart disease in her father, maternal grandfather, maternal grandmother, mother, paternal grandfather, paternal grandmother, and sister.    Review of Systems: Review of Systems  Constitutional: Negative.   Respiratory: Negative.   Cardiovascular: Negative.   Gastrointestinal: Negative.   Musculoskeletal: Negative.   Neurological: Negative.   Psychiatric/Behavioral: Negative.   All other systems reviewed and are negative.    PHYSICAL EXAM: VS:  BP 133/68 (BP Location: Right Arm, Patient Position: Sitting, Cuff Size: Normal)   Pulse 67   Ht 5\' 2"  (1.575 m)   Wt 117 lb 8 oz (53.3 kg)   LMP  (LMP Unknown)   BMI 21.49 kg/m  , BMI Body mass index is 21.49 kg/m. GEN: Well nourished, well developed, in no acute distress  HEENT: normal  Neck: no JVD, carotid bruits, or masses Cardiac: RRR; no murmurs, rubs, or gallops,no edema  Respiratory:  clear to auscultation bilaterally, normal work of breathing GI: soft, nontender, nondistended, + BS MS: no deformity or atrophy  Skin: warm and dry, no rash Neuro:  Strength and sensation are intact Psych: Alert, minimally communicative during her visit   Recent Labs: 01/29/2017: ALT 12; BUN 11; Creatinine, Ser 0.74; Potassium 4.7; Sodium 144    Lipid Panel Lab Results  Component Value Date   CHOL 211 (H) 04/19/2017   HDL 50 04/19/2017   LDLCALC 132 (H) 04/19/2017   TRIG 145 04/19/2017      Wt Readings from Last 3 Encounters:  10/24/17 117 lb 8 oz (53.3 kg)  10/23/17 118 lb (53.5 kg)  09/21/17 114 lb (51.7 kg)       ASSESSMENT AND PLAN:  Atherosclerosis of native coronary artery of native heart with stable angina pectoris ([place]) - Plan: EKG 12-Lead Currently with no symptoms of angina. No further workup at this  time. Continue current medication regimen.  PAD (peripheral artery disease) (HCC) Carotid disease, recent ultrasound performed yesterday, results pending Managed by vein and vascular  Essential hypertension Blood pressure is well controlled on today's visit. No changes made to the medications.  High cholesterol Family has stopped the statin Previous notes indicating only taking half a pill of the Zetia  Bilateral carotid artery stenosis Ultrasound pending  Dementia Most of the visit was spent discussing her baseline Able to ambulate, does not appear to be a fall risk Main deficits are cognitive  Disposition:   F/U  12 months   Total encounter time more than 45 minutes  Greater than 50% was spent in counseling and coordination of care with the patient    Orders Placed This Encounter  Procedures  . EKG 12-Lead     Signed, Esmond Plants, M.D., Ph.D. 10/24/2017  [place], [place]

## 2017-10-23 ENCOUNTER — Ambulatory Visit (INDEPENDENT_AMBULATORY_CARE_PROVIDER_SITE_OTHER): Payer: Medicare Other

## 2017-10-23 ENCOUNTER — Ambulatory Visit (INDEPENDENT_AMBULATORY_CARE_PROVIDER_SITE_OTHER): Payer: Medicare Other | Admitting: Vascular Surgery

## 2017-10-23 ENCOUNTER — Encounter (INDEPENDENT_AMBULATORY_CARE_PROVIDER_SITE_OTHER): Payer: Self-pay | Admitting: Vascular Surgery

## 2017-10-23 VITALS — BP 143/82 | HR 72 | Resp 16 | Ht 62.0 in | Wt 118.0 lb

## 2017-10-23 DIAGNOSIS — I10 Essential (primary) hypertension: Secondary | ICD-10-CM | POA: Diagnosis not present

## 2017-10-23 DIAGNOSIS — I6523 Occlusion and stenosis of bilateral carotid arteries: Secondary | ICD-10-CM

## 2017-10-23 DIAGNOSIS — I2510 Atherosclerotic heart disease of native coronary artery without angina pectoris: Secondary | ICD-10-CM | POA: Diagnosis not present

## 2017-10-23 DIAGNOSIS — F0390 Unspecified dementia without behavioral disturbance: Secondary | ICD-10-CM

## 2017-10-23 NOTE — Progress Notes (Signed)
MRN : [id number]  Christine Burch is a 77 y.o. (May 03, 1940) female who presents with chief complaint of  Chief Complaint  Patient presents with  . Follow-up    pt conv carotid ultrasound  .  History of Present Illness: patient returns in follow up of carotid disease.  She has had no major changes since her last visit.  Husband provides much of the history.  Continued cognitive decline with dementia but no focal neurologic symptoms. The patient's right carotid endarterectomy is widely patent.  Her left carotid artery stenosis is in the 40 to 59% range which her husband says has been stable for many years now.  Current Outpatient Medications  Medication Sig Dispense Refill  . aspirin 81 MG tablet Take by mouth. 2 tablets daily    . Cholecalciferol (VITAMIN D3) 2000 units TABS Take 2,000 Units by mouth daily.    . clopidogrel (PLAVIX) 75 MG tablet Take 75 mg by mouth daily.    Marland Kitchen ezetimibe (ZETIA) 10 MG tablet Take 1 tablet (10 mg total) by mouth daily. 90 tablet 3  . mirtazapine (REMERON) 15 MG tablet Take 1 tablet (15 mg total) by mouth at bedtime. 90 tablet 3  . naproxen (NAPROSYN) 500 MG tablet   0  . omega-3 fish oil (MAXEPA) 1000 MG CAPS capsule Take by mouth.    . ranitidine (ZANTAC) 150 MG tablet TAKE 1 TABLET BY MOUTH TWICE DAILY 180 tablet 3   No current facility-administered medications for this visit.     Past Medical History:  Diagnosis Date  . Acid reflux   . Colon polyp 2012, 2014  . 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 Enlargement of lymph nodes 2013  . Gallstones   . High cholesterol   . Hypertension 2013  . Memory changes 2018  . Tumors 2006    Past Surgical History:  Procedure Laterality Date  . ABDOMINAL HYSTERECTOMY  1980  . CARDIAC SURGERY  2010   stint  . CAROTID ENDARTERECTOMY Right 2011  . CHOLECYSTECTOMY N/A 04/19/2015   Procedure: LAPAROSCOPIC CHOLECYSTECTOMY;  Surgeon: [name], MD;  Location: [facility];  Service: General;   Laterality: N/A;  . COLONOSCOPY  [id number]   Dr.Hashmi  . COLONOSCOPY WITH PROPOFOL N/A 03/31/2015   Procedure: COLONOSCOPY WITH PROPOFOL;  Surgeon: [name], MD;  Location: [facility];  Service: Endoscopy;  Laterality: N/A;  . COLONOSCOPY WITH PROPOFOL N/A 03/22/2016   Procedure: COLONOSCOPY WITH PROPOFOL;  Surgeon: [name], MD;  Location: [facility];  Service: Endoscopy;  Laterality: N/A;  . CORONARY STENT PLACEMENT    . EXCISION MASS ABDOMINAL N/A 04/19/2015   (GIST): EXCISION MASS ABDOMINAL/ EXCISION GASTRIC MASS;  Surgeon: [name], MD;  Location: [facility];  Service: General;  Laterality: N/A;  . LAPAROSCOPIC RIGHT COLECTOMY Right 04/19/2015   Procedure: LAPAROSCOPIC RIGHT COLECTOMY;  Surgeon: [name], MD;  Location: [facility];  Service: General;  Laterality: Right;  . UPPER GI ENDOSCOPY  2012, 2014    Family History  Problem Relation Age of Onset  . Heart disease Mother   . Heart disease Father   . Arthritis Sister   . Heart disease Sister   . Heart disease Maternal Grandmother   . Heart disease Maternal Grandfather   . Heart disease Paternal Grandmother   . Heart disease Paternal Grandfather   . Breast cancer Neg Hx      Social History Social History  Tobacco Use  . Smoking status: Never Smoker  . Smokeless tobacco: Never Used  Substance Use Topics  . Alcohol use: No    Alcohol/week: 0.0 standard drinks  . Drug use: No     No Known Allergies    REVIEW OF SYSTEMS (Negative unless checked)  Constitutional: [] Weight loss  [] Fever  [] Chills Cardiac: [] Chest pain   [] Chest pressure   [] Palpitations   [] Shortness of breath when laying flat   [] Shortness of breath at rest   [] Shortness of breath with exertion. Vascular:  [] Pain in legs with walking   [] Pain in legs at rest   [] Pain in legs when laying flat   [] Claudication   [] Pain in feet when walking  [] Pain in feet at rest  [] Pain  in feet when laying flat   [] History of DVT   [] Phlebitis   [] Swelling in legs   [] Varicose veins   [] Non-healing ulcers Pulmonary:   [] Uses home oxygen   [] Productive cough   [] Hemoptysis   [] Wheeze  [] COPD   [] Asthma Neurologic:  [x] Dizziness  [] Blackouts   [] Seizures   [] History of stroke   [] History of TIA  [] Aphasia   [] Temporary blindness   [] Dysphagia   [] Weakness or numbness in arms   [] Weakness or numbness in legs X positive for dementia Musculoskeletal:  [] Arthritis   [] Joint swelling   [] Joint pain   [] Low back pain Hematologic:  [] Easy bruising  [] Easy bleeding   [] Hypercoagulable state   [] Anemic  [] Hepatitis Gastrointestinal:  [] Blood in stool   [] Vomiting blood  [x] Gastroesophageal reflux/heartburn   [] Abdominal pain Genitourinary:  [] Chronic kidney disease   [] Difficult urination  [] Frequent urination  [] Burning with urination   [] Hematuria Skin:  [] Rashes   [] Ulcers   [] Wounds Psychological:  [x] History of anxiety   []  History of major depression.    Physical Examination  Vitals:   10/23/17 1508  BP: (!) 143/82  Pulse: 72  Resp: 16  Weight: 118 lb (53.5 kg)  Height: 5\' 2"  (1.575 m)   Body mass index is 21.58 kg/m. Gen:  WD/WN, NAD Head: [place]/AT, No temporalis wasting. Ear/Nose/Throat: Hearing grossly intact, nares w/o erythema or drainage, trachea midline Eyes: Conjunctiva clear. Sclera non-icteric Neck: Supple.  No bruit  Pulmonary:  Good air movement, equal and clear to auscultation bilaterally.  Cardiac: RRR, No JVD Vascular:  Vessel Right Left  Radial Palpable Palpable                                   Musculoskeletal: M/S 5/5 throughout.  No deformity or atrophy.  Neurologic: CN 2-12 intact. Sensation grossly intact in extremities.  Symmetrical.  Speech is fluent. Motor exam as listed above. Psychiatric: Judgment intact, Mood & affect appropriate for pt's clinical situation. Dermatologic: No rashes or ulcers noted.  No cellulitis or open  wounds.      CBC Lab Results  Component Value Date   WBC 4.1 07/17/2016   HGB 12.5 07/17/2016   HCT 38.3 07/17/2016   MCV 85 07/17/2016   PLT 229 07/17/2016    BMET    Component Value Date/Time   NA 144 01/29/2017 1319   K 4.7 01/29/2017 1319   CL 106 01/29/2017 1319   CO2 23 01/29/2017 1319   GLUCOSE 90 01/29/2017 1319   GLUCOSE 127 (H) 2020-08-2815 0359   BUN 11 01/29/2017 1319   CREATININE 0.74 01/29/2017 1319   CALCIUM 9.5 01/29/2017 1319  GFRNONAA 80 01/29/2017 1319   GFRAA 92 01/29/2017 1319   CrCl cannot be calculated (Patient's most recent lab result is older than the maximum 21 days allowed.).  COAG No results found for: INR, PROTIME  Radiology No results found.   Assessment/Plan Hypertension blood pressure control important in reducing the progression of atherosclerotic disease. On appropriate oral medications.   CAD (coronary artery disease) S/p stent placement.  Follows with cardiology.  Carotid stenosis The patient's right carotid endarterectomy is widely patent.  Her left carotid artery stenosis is in the 40 to 59% range which her husband says has been stable for many years now.  No intervention would currently be of benefit.  Continue current medical regimen including aspirin and Plavix.  Recheck in 1 year with carotid duplex    Leotis Pain, MD  10/23/2017 3:43 PM    This note was created with Dragon medical transcription system.  Any errors from dictation are purely unintentional

## 2017-10-23 NOTE — Assessment & Plan Note (Signed)
The patient's right carotid endarterectomy is widely patent.  Her left carotid artery stenosis is in the 40 to 59% range which her husband says has been stable for many years now.  No intervention would currently be of benefit.  Continue current medical regimen including aspirin and Plavix.  Recheck in 1 year with carotid duplex

## 2017-10-23 NOTE — Patient Instructions (Signed)
Carotid Artery Disease The carotid arteries are arteries on both sides of the neck. They carry blood to the brain. Carotid artery disease is when the arteries get smaller (narrow) or get blocked. If these arteries get smaller or get blocked, you are more likely to have a stroke or warning stroke (transient ischemic attack). Follow these instructions at home:  Take medicines as told by your doctor. Make sure you understand all your medicine instructions. Do not stop your medicines without talking to your doctor first.  Follow your doctor's diet instructions. It is important to eat a healthy diet that includes plenty of: ? Fresh fruits. ? Vegetables. ? Lean meats.  Avoid: ? High-fat foods. ? High-sodium foods. ? Foods that are fried, overly processed, or have poor nutritional value.  Stay a healthy weight.  Stay active. Get at least 30 minutes of activity every day.  Do not smoke.  Limit alcohol use to: ? No more than 2 drinks a day for men. ? No more than 1 drink a day for women who are not pregnant.  Do not use illegal drugs.  Keep all doctor visits as told. Get help right away if:  You have sudden weakness or loss of feeling (numbness) on one side of the body, such as the face, arm, or leg.  You have sudden confusion.  You have trouble speaking (aphasia) or understanding.  You have sudden trouble seeing out of one or both eyes.  You have sudden trouble walking.  You have dizziness or feel like you might pass out (faint).  You have a loss of balance or your movements are not steady (uncoordinated).  You have a sudden, severe headache with no known cause.  You have trouble swallowing (dysphagia). Call your local emergency services (911 in [place] Do notdrive yourself to the clinic or hospital. This information is not intended to replace advice given to you by your health care provider. Make sure you discuss any questions you have with your health care  provider. Document Released: 12/27/2011 Document Revised: 06/17/2015 Document Reviewed: 07/10/2012 Elsevier Interactive Patient Education  2018 [employer]  

## 2017-10-24 ENCOUNTER — Encounter: Payer: Self-pay | Admitting: Cardiovascular Disease

## 2017-10-24 ENCOUNTER — Ambulatory Visit (INDEPENDENT_AMBULATORY_CARE_PROVIDER_SITE_OTHER): Payer: Medicare Other | Admitting: Cardiovascular Disease

## 2017-10-24 VITALS — BP 133/68 | HR 67 | Ht 62.0 in | Wt 117.5 lb

## 2017-10-24 DIAGNOSIS — I25118 Atherosclerotic heart disease of native coronary artery with other forms of angina pectoris: Secondary | ICD-10-CM

## 2017-10-24 DIAGNOSIS — I6523 Occlusion and stenosis of bilateral carotid arteries: Secondary | ICD-10-CM

## 2017-10-24 DIAGNOSIS — I10 Essential (primary) hypertension: Secondary | ICD-10-CM | POA: Diagnosis not present

## 2017-10-24 DIAGNOSIS — E7800 Pure hypercholesterolemia, unspecified: Secondary | ICD-10-CM | POA: Diagnosis not present

## 2017-10-24 DIAGNOSIS — I739 Peripheral vascular disease, unspecified: Secondary | ICD-10-CM | POA: Insufficient documentation

## 2017-10-24 NOTE — Patient Instructions (Signed)

## 2017-11-19 ENCOUNTER — Telehealth: Payer: Self-pay | Admitting: Nurse Practitioner

## 2017-11-19 NOTE — Telephone Encounter (Signed)
Copied from [place] [id number]. Topic: Referral - Request for Referral >> Nov 19, 2017  9:08 AM [name] wrote: Has patient seen PCP for this complaint? No *If NO, is insurance requiring patient see PCP for this issue before PCP can refer them? Patient's husband is unsure, said if she needs to come in for an appt, he will be glad to make one. Referral for which specialty: Physical Therapy Preferred provider/office: [facility], [place] Reason for referral: brace for humped back/shoulder, patient can not stand up straight.

## 2017-11-23 ENCOUNTER — Ambulatory Visit (INDEPENDENT_AMBULATORY_CARE_PROVIDER_SITE_OTHER): Payer: Medicare Other | Admitting: Nurse Practitioner

## 2017-11-23 ENCOUNTER — Encounter: Payer: Self-pay | Admitting: Nurse Practitioner

## 2017-11-23 DIAGNOSIS — R220 Localized swelling, mass and lump, head: Secondary | ICD-10-CM | POA: Diagnosis not present

## 2017-11-23 DIAGNOSIS — I6523 Occlusion and stenosis of bilateral carotid arteries: Secondary | ICD-10-CM

## 2017-11-23 DIAGNOSIS — E7800 Pure hypercholesterolemia, unspecified: Secondary | ICD-10-CM

## 2017-11-23 DIAGNOSIS — R269 Unspecified abnormalities of gait and mobility: Secondary | ICD-10-CM | POA: Diagnosis not present

## 2017-11-23 DIAGNOSIS — F0390 Unspecified dementia without behavioral disturbance: Secondary | ICD-10-CM

## 2017-11-23 MED ORDER — MIRTAZAPINE 7.5 MG PO TABS: 7.5000 mg | ORAL_TABLET | Freq: Every day | ORAL | 3 refills | Status: DC

## 2017-11-23 NOTE — Assessment & Plan Note (Signed)
Progressive dementia.  No falls.  More rapid pace with slow steps, slightly unsteady with lean forward.  Fall risk.  PT consult placed.

## 2017-11-23 NOTE — Progress Notes (Signed)
BP 124/72 (BP Location: Left Arm, Patient Position: Sitting, Cuff Size: Small)   Pulse 72   Temp (!) 97.5 F (36.4 C) (Axillary)   Ht 5' (1.524 m)   Wt 116 lb 12.8 oz (53 kg)   LMP  (LMP Unknown)   SpO2 98%   BMI 22.81 kg/m    Subjective:    Patient ID: Christine Burch, female    DOB: Feb 10, 1940, 77 y.o.   MRN: [id number]  HPI: Christine Burch is a 77 y.o. female  Chief Complaint  Patient presents with  . Back Problem    Patient keeps bending over, may need a referral for physical therapy. Thinking about back brace.   . Facial Swelling    Husband states she's swelling under eye  . Hyperlipidemia    Husband states she hasn't had her cholesterol checked in a while.   . Sleeping Problem    Patient has been falling asleep a lot, during eating etc.    Husband present at bedside and provides HPI and ROS, patient minimal communication d/t dementia.  DEMENTIA: Not on memory care medication.  Discussed disease trajectory with pt husband, he is sole caregiver for her.  Recommended palliative consult and in house support to assist him, at this time he is not interested in this.  Became teary during conversation and stated "I try to take good care of her".  Provider offered active listening and support.  Discussed that support is helpful to prevent caregiver burnout as the disease progresses.  He denies pt having recent falls or injuries.  Does report she is more fatigue and naps more.  Husband reports he wakes her up at 9 am and gets her ready.  At approximately 2 pm she falls asleep and then wakes up again at 5 pm.  Then she returns to sleep around 9:30 or 10 pm. Currently on Mirtazapine at HS '15MG'$ .  Discussed with him splitting tablet in half and attempting smaller dose, which he agrees with at this time.  Incontinent of urine at baseline, husband provides all care.  He reports no behavior issues.    GAIT ISSUES: Her husband reports patient frequently slumps over.  She does this while sleeping  or sitting up in chair and while walking.  He wishes to have a PT consult for a back brace, discussed this may not be beneficial for patient.  Discussed PT/OT consult for strengthening to prevent falls and possible W/C that patient can use as needed for times she is more fatigued with advancing dementia.  FACIAL SWELLING: Husband reports concerns about her having swelling under her eyes. Often sleeps sitting up and walks slumped over, with head leaned forward while upright.  No new medications or medication changes recently.  No new foods.  HYPERLIPIDEMIA Hyperlipidemia status: excellent compliance, ezetimiibe Satisfied with current treatment?  yes Side effects:  no Medication compliance: excellent compliance Past cholesterol meds: none  Supplements: none Aspirin:  yes The 10-year ASCVD risk score Mikey Bussing DC Jr., et al., 2013) is: 15.2%   Values used to calculate the score:     Age: 44 years     Sex: Female     Is Non-Hispanic African American: No     Diabetic: No     Tobacco smoker: No     Systolic Blood Pressure: 619 mmHg     Is BP treated: No     HDL Cholesterol: 50 mg/dL     Total Cholesterol: 211 mg/dL Chest pain:  no Coronary artery disease:  no Family history CAD:  no Family history early CAD:  no   Relevant past medical, surgical, family and social history reviewed and updated as indicated. Interim medical history since our last visit reviewed. Allergies and medications reviewed and updated.  Review of Systems  Constitutional: Positive for fatigue. Negative for activity change, appetite change, diaphoresis and unexpected weight change.  Respiratory: Negative for cough, chest tightness, shortness of breath and wheezing.   Cardiovascular: Negative for chest pain, palpitations and leg swelling.  Gastrointestinal: Negative for abdominal distention, abdominal pain, nausea and vomiting.  Endocrine: Negative for cold intolerance, heat intolerance, polydipsia, polyphagia and  polyuria.  Genitourinary: Negative for dysuria and frequency.  Musculoskeletal: Positive for gait problem.  Neurological: Negative for dizziness, numbness and headaches.  Psychiatric/Behavioral: Negative for behavioral problems and sleep disturbance. The patient is not nervous/anxious.     Per HPI unless specifically indicated above     Objective:    BP 124/72 (BP Location: Left Arm, Patient Position: Sitting, Cuff Size: Small)   Pulse 72   Temp (!) 97.5 F (36.4 C) (Axillary)   Ht 5' (1.524 m)   Wt 116 lb 12.8 oz (53 kg)   LMP  (LMP Unknown)   SpO2 98%   BMI 22.81 kg/m   Wt Readings from Last 3 Encounters:  11/23/17 116 lb 12.8 oz (53 kg)  10/24/17 117 lb 8 oz (53.3 kg)  10/23/17 118 lb (53.5 kg)    Physical Exam  Constitutional: She appears well-developed and well-nourished. She appears lethargic.  Patient sleeping, this appointment is during her nap period. She does arouse to verbal and tactile stimuli, interacts with provider.  Smiles and simple responses like "yes", "no".  HENT:  Head: Normocephalic and atraumatic.  Right Ear: Hearing, tympanic membrane and ear canal normal.  Left Ear: Hearing, tympanic membrane and ear canal normal.  Nose: Right sinus exhibits no maxillary sinus tenderness and no frontal sinus tenderness. Left sinus exhibits no maxillary sinus tenderness and no frontal sinus tenderness.  Eyes: Pupils are equal, round, and reactive to light. Conjunctivae and EOM are normal. Right eye exhibits no discharge. Left eye exhibits no discharge.  Mild swelling under eyes, pt leaning forward while resting and walking.  Neck: Normal range of motion. Neck supple. No JVD present. Carotid bruit is not present. No thyromegaly present.  Cardiovascular: Normal rate, regular rhythm and normal heart sounds.  Pulmonary/Chest: Effort normal and breath sounds normal.  Abdominal: Soft. Bowel sounds are normal. There is no splenomegaly or hepatomegaly.  Lymphadenopathy:     She has no cervical adenopathy.  Neurological: She appears lethargic. Gait abnormal.  Reflex Scores:      Brachioradialis reflexes are 1+ on the right side and 1+ on the left side.      Patellar reflexes are 1+ on the right side and 1+ on the left side. Patient has unsteady gait, requires support (holding patient arm) while walking.  Rapid pace with forward lean and short steps.  On leaving office she was noted to be holding husband's hand and pulling him along.  Skin: Skin is warm and dry.  Psychiatric: She has a normal mood and affect. Her behavior is normal.    Results for orders placed or performed in visit on 04/19/17  Lipid Panel w/o Chol/HDL Ratio  Result Value Ref Range   Cholesterol, Total 211 (H) 100 - 199 mg/dL   Triglycerides 145 0 - 149 mg/dL   HDL 50 >39 mg/dL   VLDL Cholesterol Cal 29 5 -  40 mg/dL   LDL Calculated 132 (H) 0 - 99 mg/dL      Assessment & Plan:   Problem List Items Addressed This Visit      Nervous and Auditory   Dementia (HCC)    Chronic, progressive.  No memory care meds.  Will decrease Mirtazapine to 7.5 MG QHS.      Relevant Medications   mirtazapine (REMERON) 7.5 MG tablet   Other Relevant Orders   Comp Met (CMET)   CBC w/Diff   Ambulatory referral to Physical Therapy     Other   High cholesterol    Lipid panel today.  Continue current medication.      Relevant Orders   Lipid Profile   Gait disturbance    Progressive dementia.  No falls.  More rapid pace with slow steps, slightly unsteady with lean forward.  Fall risk.  PT consult placed.      Facial swelling    Mild swelling under eyes, appears positional d/t patient leaning forward frequently while sleeping and walking.  Continue to monitor.         Time: 25 minutes, >50% spent counseling/or care coordination, offering support to dementia caregiver/husband  Follow up plan: Return in about 6 months (around 05/24/2018) for dementia.

## 2017-11-23 NOTE — Patient Instructions (Signed)
Dementia Caregiver Guide Dementia is a term used to describe a number of symptoms that affect memory and thinking. The most common symptoms include:  Memory loss.  Trouble with language and communication.  Trouble concentrating.  Poor judgment.  Problems with reasoning.  Child-like behavior and language.  Extreme anxiety.  Angry outbursts.  Wandering from home or public places.  Dementia usually gets worse slowly over time. In the early stages, people with dementia can stay independent and safe with some help. In later stages, they need help with daily tasks such as dressing, grooming, and using the bathroom. How to help the person with dementia cope Dementia can be frightening and confusing. Here are some tips to help the person with dementia cope with changes caused by the disease. General tips  Keep the person on track with his or her routine.  Try to identify areas where the person may need help.  Be supportive, patient, calm, and encouraging.  Gently remind the person that adjusting to changes takes time.  Help with the tasks that the person has asked for help with.  Keep the person involved in daily tasks and decisions as much as possible.  Encourage conversation, but try not to get frustrated or harried if the person struggles to find words or does not seem to appreciate your help. Communication tips  When the person is talking or seems frustrated, make eye contact and hold the person's hand.  Ask specific questions that need yes or no answers.  Use simple words, short sentences, and a calm voice. Only give one direction at a time.  When offering choices, limit them to just 1 or 2.  Avoid correcting the person in a negative way.  If the person is struggling to find the right words, gently try to help him or her. How to recognize symptoms of stress Symptoms of stress in caregivers include:  Feeling frustrated or angry with the person with  dementia.  Denying that the person has dementia or that his or her symptoms will not improve.  Feeling hopeless and unappreciated.  Difficulty sleeping.  Difficulty concentrating.  Feeling anxious, irritable, or depressed.  Developing stress-related health problems.  Feeling like you have too little time for your own life.  Follow these instructions at home:  Make sure that you and the person you are caring for: ? Get regular sleep. ? Exercise regularly. ? Eat regular, nutritious meals. ? Drink enough fluid to keep your urine clear or pale yellow. ? Take over-the-counter and prescription medicines only as told by your health care providers. ? Attend all scheduled health care appointments.  Join a support group with others who are caregivers.  Ask about respite care resources so that you can have a regular break from the stress of caregiving.  Look for signs of stress in yourself and in the person you are caring for. If you notice signs of stress, take steps to manage it.  Consider any safety risks and take steps to avoid them.  Organize medications in a pill box for each day of the week.  Create a plan to handle any legal or financial matters. Get legal or financial advice if needed.  Keep a calendar in a central location to remind the person of appointments or other activities. Tips for reducing the risk of injury  Keep floors clear of clutter. Remove rugs, magazine racks, and floor lamps.  Keep hallways well lit, especially at night.  Put a handrail and nonslip mat in the bathtub   or shower.  Put childproof locks on cabinets that contain dangerous items, such as medicines, alcohol, guns, toxic cleaning items, sharp tools or utensils, matches, and lighters.  Put the locks in places where the person cannot see or reach them easily. This will help ensure that the person does not wander out of the house and get lost.  Be prepared for emergencies. Keep a list of  emergency phone numbers and addresses in a convenient area.  Remove car keys and lock garage doors so that the person does not try to get in the car and drive.  Have the person wear a bracelet that tracks locations and identifies the person as having memory problems. This should be worn at all times for safety. Where to find support: Many individuals and organizations offer support. These include:  Support groups for people with dementia and for caregivers.  Counselors or therapists.  Home health care services.  Adult day care centers.  Where to find more information: Alzheimer's Association: www.alz.org Contact a health care provider if:  The person's health is rapidly getting worse.  You are no longer able to care for the person.  Caring for the person is affecting your physical and emotional health.  The person threatens himself or herself, you, or anyone else. Summary  Dementia is a term used to describe a number of symptoms that affect memory and thinking.  Dementia usually gets worse slowly over time.  Take steps to reduce the person's risk of injury, and to plan for future care.  Caregivers need support, relief from caregiving, and time for their own lives. This information is not intended to replace advice given to you by your health care provider. Make sure you discuss any questions you have with your health care provider. Document Released: 12/14/2015 Document Revised: 12/14/2015 Document Reviewed: 12/14/2015 Elsevier Interactive Patient Education  2018 Elsevier Inc.  

## 2017-11-23 NOTE — Assessment & Plan Note (Addendum)
Lipid panel today.  Continue current medication.

## 2017-11-23 NOTE — Assessment & Plan Note (Addendum)
Chronic, progressive.  No memory care meds.  Will decrease Mirtazapine to 7.5 MG QHS.

## 2017-11-23 NOTE — Assessment & Plan Note (Signed)
Mild swelling under eyes, appears positional d/t patient leaning forward frequently while sleeping and walking.  Continue to monitor.

## 2017-11-24 LAB — LIPID PANEL
Chol/HDL Ratio: 4.8 ratio — ABNORMAL HIGH (ref 0.0–4.4)
Cholesterol, Total: 218 mg/dL — ABNORMAL HIGH (ref 100–199)
HDL: 45 mg/dL (ref >39)
LDL Calculated: 132 mg/dL — ABNORMAL HIGH (ref 0–99)
TRIGLYCERIDES: 207 mg/dL — AB (ref 0–149)
VLDL CHOLESTEROL CAL: 41 mg/dL — AB (ref 5–40)

## 2017-11-24 LAB — COMPREHENSIVE METABOLIC PANEL
A/G RATIO: 1.3 (ref 1.2–2.2)
ALBUMIN: 3.9 g/dL (ref 3.5–4.8)
ALK PHOS: 57 IU/L (ref 39–117)
ALT: 16 IU/L (ref 0–32)
AST: 19 IU/L (ref 0–40)
BUN / CREAT RATIO: 14 (ref 12–28)
BUN: 13 mg/dL (ref 8–27)
Bilirubin Total: 0.6 mg/dL (ref 0.0–1.2)
CO2: 24 mmol/L (ref 20–29)
Calcium: 9.8 mg/dL (ref 8.7–10.3)
Chloride: 103 mmol/L (ref 96–106)
Creatinine, Ser: 0.92 mg/dL (ref 0.57–1.00)
GFR calc non Af Amer: 61 mL/min/{1.73_m2} (ref >59)
GFR, EST AFRICAN AMERICAN: 70 mL/min/{1.73_m2} (ref >59)
GLOBULIN, TOTAL: 2.9 g/dL (ref 1.5–4.5)
Glucose: 92 mg/dL (ref 65–99)
POTASSIUM: 4 mmol/L (ref 3.5–5.2)
SODIUM: 141 mmol/L (ref 134–144)
TOTAL PROTEIN: 6.8 g/dL (ref 6.0–8.5)

## 2017-11-24 LAB — CBC WITH DIFFERENTIAL/PLATELET
BASOS ABS: 0.1 10*3/uL (ref 0.0–0.2)
Basos: 1 %
EOS (ABSOLUTE): 0.1 10*3/uL (ref 0.0–0.4)
Eos: 2 %
HEMOGLOBIN: 12.9 g/dL (ref 11.1–15.9)
Hematocrit: 38.9 % (ref 34.0–46.6)
Immature Grans (Abs): 0 10*3/uL (ref 0.0–0.1)
Immature Granulocytes: 0 %
LYMPHS ABS: 1.2 10*3/uL (ref 0.7–3.1)
Lymphs: 19 %
MCH: 27.4 pg (ref 26.6–33.0)
MCHC: 33.2 g/dL (ref 31.5–35.7)
MCV: 83 fL (ref 79–97)
MONOCYTES: 10 %
MONOS ABS: 0.6 10*3/uL (ref 0.1–0.9)
NEUTROS ABS: 4.2 10*3/uL (ref 1.4–7.0)
Neutrophils: 68 %
Platelets: 288 10*3/uL (ref 150–450)
RBC: 4.7 x10E6/uL (ref 3.77–5.28)
RDW: 13.5 % (ref 12.3–15.4)
WBC: 6.2 10*3/uL (ref 3.4–10.8)

## 2017-11-26 ENCOUNTER — Telehealth: Payer: Self-pay

## 2017-11-26 NOTE — Telephone Encounter (Signed)
Called to discuss labs with husband per [name]. While on the phone, patient's husband asked about referral. He states that the PT referral was sent to [facility], which is very far away from the patient. He states he would like to go somewhere in [place], mentioned [facility].

## 2017-11-27 DIAGNOSIS — M47812 Spondylosis without myelopathy or radiculopathy, cervical region: Secondary | ICD-10-CM | POA: Diagnosis not present

## 2017-11-27 DIAGNOSIS — S81812A Laceration without foreign body, left lower leg, initial encounter: Secondary | ICD-10-CM | POA: Diagnosis not present

## 2017-11-27 DIAGNOSIS — R220 Localized swelling, mass and lump, head: Secondary | ICD-10-CM | POA: Diagnosis not present

## 2017-11-27 DIAGNOSIS — S0003XA Contusion of scalp, initial encounter: Secondary | ICD-10-CM | POA: Diagnosis not present

## 2017-11-27 DIAGNOSIS — S0093XA Contusion of unspecified part of head, initial encounter: Secondary | ICD-10-CM | POA: Diagnosis not present

## 2017-11-27 DIAGNOSIS — S199XXA Unspecified injury of neck, initial encounter: Secondary | ICD-10-CM | POA: Diagnosis not present

## 2017-11-27 DIAGNOSIS — Z23 Encounter for immunization: Secondary | ICD-10-CM | POA: Diagnosis not present

## 2017-11-27 DIAGNOSIS — Z79899 Other long term (current) drug therapy: Secondary | ICD-10-CM | POA: Diagnosis not present

## 2017-11-27 DIAGNOSIS — W19XXXA Unspecified fall, initial encounter: Secondary | ICD-10-CM | POA: Diagnosis not present

## 2017-11-27 DIAGNOSIS — F0390 Unspecified dementia without behavioral disturbance: Secondary | ICD-10-CM | POA: Diagnosis not present

## 2017-11-27 DIAGNOSIS — S0990XA Unspecified injury of head, initial encounter: Secondary | ICD-10-CM | POA: Diagnosis not present

## 2017-11-27 DIAGNOSIS — I2510 Atherosclerotic heart disease of native coronary artery without angina pectoris: Secondary | ICD-10-CM | POA: Diagnosis not present

## 2017-12-04 NOTE — Telephone Encounter (Signed)
Pt's spouse calling.  States that they want to see if the referral has been made to [facility] in [place] yet.  The [place] location at [facility] is too far away for them.

## 2017-12-04 NOTE — Telephone Encounter (Signed)
Routing to referral coordinator.

## 2017-12-07 ENCOUNTER — Telehealth: Payer: Self-pay | Admitting: Unknown Physician Specialty

## 2017-12-07 NOTE — Telephone Encounter (Signed)
Patient's husband called to the [place] about this request below, he was told by the Agent the office is closed and message has been sent to the provider for review.

## 2017-12-07 NOTE — Telephone Encounter (Signed)
Copied from [place] [id number]. Topic: Quick Communication - Rx Refill/Question >> Dec 07, 2017  4:25 PM Waldemar Dickens, Sade R wrote: Medication:mirtazapine (REMERON) 7.5 MG tablet  Patients husband states with med being cut in half she is still sleeping to much and he wants to know if there is another med that can be given to her to avoid her sleeping so much

## 2017-12-10 ENCOUNTER — Other Ambulatory Visit: Payer: Self-pay | Admitting: Nurse Practitioner

## 2017-12-10 DIAGNOSIS — R269 Unspecified abnormalities of gait and mobility: Secondary | ICD-10-CM

## 2017-12-10 DIAGNOSIS — F0390 Unspecified dementia without behavioral disturbance: Secondary | ICD-10-CM

## 2017-12-10 NOTE — Telephone Encounter (Signed)
Patient's husband called and said he would like to speak to Dr Jeananne Rama to give a complaint that he has not received a call back. He would like to be call back at [phone].

## 2017-12-10 NOTE — Progress Notes (Signed)
Spoke to patient's husband via telephone and discussed Mirtazapine with him.  He reports patient has been sleeping well at night and has concerned as patient is imtermittently still increased fatigue.  Discussed with him discontinuation of Mirtazapine, as had reduced dose in 1/2 at last visit.  Patient has progressing dementia.  He reports patient had a fall on a couple weeks ago, while he was assisting her with care.  He took her to ER as she had laceration to left leg and abrasion to arm + "some bruises". They did EKG, blood work, CT scan with no abnormal findings from baseline.  He reports they sutured laceration to legs.  No further falls.  Physical therapy consult ordered, as initial consult did not go through.  Consult to [facility] per husband request.  He would like W/C to use as needed, recommended by this provider at last visit.  Also to work on strengthening for further fall prevention.  Reiterated previous discussion with husband, discussed dementia and stage of disease process.  Again discussed palliative or hospice consult to provide extra hands to husband in care of patient.  He reports he is "still doing okay on my own".  Will continue these discussions ongoing, as feel they would benefit from assistance in the house.  Encouraged him to call at any time with concerns or for further education on dementia.

## 2017-12-10 NOTE — Telephone Encounter (Signed)
I spoke with husband at length this afternoon.

## 2017-12-10 NOTE — Telephone Encounter (Signed)
Forwarding to Engineer, building services.

## 2017-12-11 DIAGNOSIS — Z23 Encounter for immunization: Secondary | ICD-10-CM | POA: Diagnosis not present

## 2017-12-11 DIAGNOSIS — Z4802 Encounter for removal of sutures: Secondary | ICD-10-CM | POA: Diagnosis not present

## 2017-12-11 DIAGNOSIS — F0390 Unspecified dementia without behavioral disturbance: Secondary | ICD-10-CM | POA: Diagnosis not present

## 2017-12-11 DIAGNOSIS — Z7982 Long term (current) use of aspirin: Secondary | ICD-10-CM | POA: Diagnosis not present

## 2017-12-11 DIAGNOSIS — I2510 Atherosclerotic heart disease of native coronary artery without angina pectoris: Secondary | ICD-10-CM | POA: Diagnosis not present

## 2017-12-12 ENCOUNTER — Telehealth: Payer: Self-pay | Admitting: Nurse Practitioner

## 2017-12-12 NOTE — Telephone Encounter (Signed)
Christine Burch called in to speak with someone in the office. He states that he did not receive a call back in a timely manner when he called Friday. Per telephone messages [name] called and spoke in depth with him on Monday 12/10/17. Forwarded call to office Environmental manager.

## 2017-12-12 NOTE — Telephone Encounter (Signed)
Phone call Discussed with patients husband about the patient's dementia and sleeping all day.  Reviewed that this was normal especially after stopping any medications that could cause drowsiness.  Will observe though if problems persists may need to go to the emergency room to further evaluate.

## 2017-12-12 NOTE — Telephone Encounter (Signed)
Patient's husband Christine Burch would like a provider to call him as soon as possible regarding his wife Christine Burch.  Pt's husband states that his wife has been sleeping all day and is sitting up in a chair but will not open her eyes.  Caller requested that Dr. Jeananne Rama call him since [name] is not in the office because he is worried and does not know what to do.  Please call.

## 2017-12-13 NOTE — Addendum Note (Signed)
Addended by: Marnee Guarneri T on: 12/13/2017 09:20 PM   Modules accepted: Orders

## 2017-12-19 DIAGNOSIS — M6281 Muscle weakness (generalized): Secondary | ICD-10-CM | POA: Diagnosis not present

## 2017-12-19 DIAGNOSIS — R269 Unspecified abnormalities of gait and mobility: Secondary | ICD-10-CM | POA: Diagnosis not present

## 2017-12-24 ENCOUNTER — Other Ambulatory Visit: Payer: Self-pay | Admitting: Nurse Practitioner

## 2017-12-24 ENCOUNTER — Telehealth: Payer: Self-pay | Admitting: Nurse Practitioner

## 2017-12-24 DIAGNOSIS — R269 Unspecified abnormalities of gait and mobility: Secondary | ICD-10-CM | POA: Diagnosis not present

## 2017-12-24 DIAGNOSIS — M6281 Muscle weakness (generalized): Secondary | ICD-10-CM | POA: Diagnosis not present

## 2017-12-24 MED ORDER — OMEPRAZOLE 20 MG PO CPDR: 20.0000 mg | DELAYED_RELEASE_CAPSULE | Freq: Every day | ORAL | 5 refills | Status: AC

## 2017-12-24 NOTE — Telephone Encounter (Signed)
I do not have DMV paperwork for them filled out.  I have not received call or notification from pharmacy or alternative medication advice.  Can we reach out to pharmacy or find out which pharmacy is their main pharmacy so I can follow-up on this?  Thank you.

## 2017-12-24 NOTE — Telephone Encounter (Signed)
Called pharmacy and they state that they suggest famotidine. They state that they have the 20 mg in stock.

## 2017-12-24 NOTE — Telephone Encounter (Signed)
Copied from [place] [id number]. Topic: Quick Communication - See Telephone Encounter >> Dec 24, 2017 10:41 AM Blase Mess A wrote: CRM for notification. See Telephone encounter for: 12/24/17.  [employer] is calling regarding ranitidine (ZANTAC) 150 MG tablet [[id number]] Medication has been recalled. The pharmacy has reached out to the office for a replacement. Patient has gone to the pharmacy to pick it up and it was not there. Please advise.     Patient is also coming in to pick up a copy of the [facility] documentation for the handicap sticker.   If the patient needs to be seen she can come today. [phone]

## 2017-12-24 NOTE — Telephone Encounter (Signed)
Can we call [facility] and find out their recommendations for alternatives for Zantac since Zantac is on recall and I never received their faxed form.  Thanks

## 2017-12-24 NOTE — Progress Notes (Signed)
Pt husband called notifying provider of recall on Zantac and medications in family.  Spoke to pharmacy and they recommend PPI.  Will start low dose Omeprazole at this time.  Will continue to encourage continued medication minimization with husband.

## 2018-01-04 ENCOUNTER — Telehealth: Payer: Self-pay | Admitting: Nurse Practitioner

## 2018-01-04 NOTE — Telephone Encounter (Signed)
Can we call in or fax prescription for wheelchair to listed group.

## 2018-01-04 NOTE — Telephone Encounter (Signed)
Prescription signed and faxed as requested. Patient's husband notified.

## 2018-01-04 NOTE — Telephone Encounter (Signed)
Copied from [place] [id number]. Topic: Quick Communication - See Telephone Encounter >> Jan 04, 2018  2:00 PM Blase Mess A wrote: CRM for notification. See Telephone encounter for: 01/04/18.  The patient's husband is calling because he is requesting a prescription from [facility] for a Wheelchair. [place] [phone] [place], [place] [phone] Emilio Math [phone]

## 2018-01-14 DIAGNOSIS — R269 Unspecified abnormalities of gait and mobility: Secondary | ICD-10-CM | POA: Diagnosis not present

## 2018-01-14 DIAGNOSIS — M6281 Muscle weakness (generalized): Secondary | ICD-10-CM | POA: Diagnosis not present

## 2018-01-21 DIAGNOSIS — R269 Unspecified abnormalities of gait and mobility: Secondary | ICD-10-CM | POA: Diagnosis not present

## 2018-01-21 DIAGNOSIS — M6281 Muscle weakness (generalized): Secondary | ICD-10-CM | POA: Diagnosis not present

## 2018-02-01 NOTE — Telephone Encounter (Signed)
Called [facility] at the number provided by the patient's husband. Did not get an answer but I left a VM asking for them to please return my call.

## 2018-02-01 NOTE — Telephone Encounter (Signed)
Can we look into this since the script was faxed 4 weeks ago.  Then let [name] know update. Thank you.

## 2018-02-01 NOTE — Telephone Encounter (Signed)
Pt husband is calling to state [place]- has not received his request for a lite wheelchair for his wife. [name] stated he has called 3 times in regards to this matter ( no notes reflected). [name] is requesting a call back with updated information. The best contact number is [phone].   The order will need to be sent to:  [facility] [phone] [place], [place] [address] [place]  Please advise

## 2018-02-04 NOTE — Telephone Encounter (Signed)
Called and spoke with customer service at [facility]. They state that they faxed back paperwork on 01/28/18 and 01/31/18 for [place] to sign. Will see if paperwork is here, if not will call them back and have them resend the paperwork.

## 2018-02-04 NOTE — Telephone Encounter (Signed)
Called and left another VM with [facility] asking for them to please give me a call back to discuss order.

## 2018-02-04 NOTE — Telephone Encounter (Signed)
Thank you :)

## 2018-02-04 NOTE — Telephone Encounter (Signed)
Can we follow-up on this with her husband?  Thanks.  See who we need to call as order was sent in.

## 2018-02-07 ENCOUNTER — Ambulatory Visit: Payer: Medicare Other | Admitting: Nurse Practitioner

## 2018-02-07 NOTE — Telephone Encounter (Signed)
Patient's husband, [name], calling to check the status of this. States that the paperwork was sent on Tuesday 02/05/2018 and it needs to be signed so his wife can get the wheelchair needed.

## 2018-02-08 NOTE — Telephone Encounter (Signed)
Paperwork was received and faxed back on Wednesday. Will call patient's husband and let him know.

## 2018-02-08 NOTE — Telephone Encounter (Signed)
Patient;s husband notified. He states he will call them and follow up.

## 2018-02-12 ENCOUNTER — Ambulatory Visit (INDEPENDENT_AMBULATORY_CARE_PROVIDER_SITE_OTHER): Payer: Medicare Other | Admitting: Nurse Practitioner

## 2018-02-12 ENCOUNTER — Encounter: Payer: Self-pay | Admitting: Nurse Practitioner

## 2018-02-12 VITALS — BP 130/72 | HR 68 | Temp 98.0°F | Ht 60.0 in | Wt 113.5 lb

## 2018-02-12 DIAGNOSIS — K219 Gastro-esophageal reflux disease without esophagitis: Secondary | ICD-10-CM | POA: Insufficient documentation

## 2018-02-12 DIAGNOSIS — F0390 Unspecified dementia without behavioral disturbance: Secondary | ICD-10-CM | POA: Diagnosis not present

## 2018-02-12 DIAGNOSIS — E7800 Pure hypercholesterolemia, unspecified: Secondary | ICD-10-CM | POA: Diagnosis not present

## 2018-02-12 NOTE — Assessment & Plan Note (Signed)
Chronic, progressive.  No memory care medications.  Continue to discuss trajectory and encourage assistance in home.  Connected Care consult to assist in getting dietary supplements in home.

## 2018-02-12 NOTE — Patient Instructions (Signed)
Dementia Caregiver Guide Dementia is a term used to describe a number of symptoms that affect memory and thinking. The most common symptoms include:  Memory loss.  Trouble with language and communication.  Trouble concentrating.  Poor judgment.  Problems with reasoning.  Child-like behavior and language.  Extreme anxiety.  Angry outbursts.  Wandering from home or public places. Dementia usually gets worse slowly over time. In the early stages, people with dementia can stay independent and safe with some help. In later stages, they need help with daily tasks such as dressing, grooming, and using the bathroom. How to help the person with dementia cope Dementia can be frightening and confusing. Here are some tips to help the person with dementia cope with changes caused by the disease. General tips  Keep the person on track with his or her routine.  Try to identify areas where the person may need help.  Be supportive, patient, calm, and encouraging.  Gently remind the person that adjusting to changes takes time.  Help with the tasks that the person has asked for help with.  Keep the person involved in daily tasks and decisions as much as possible.  Encourage conversation, but try not to get frustrated or harried if the person struggles to find words or does not seem to appreciate your help. Communication tips  When the person is talking or seems frustrated, make eye contact and hold the person's hand.  Ask specific questions that need yes or no answers.  Use simple words, short sentences, and a calm voice. Only give one direction at a time.  When offering choices, limit them to just 1 or 2.  Avoid correcting the person in a negative way.  If the person is struggling to find the right words, gently try to help him or her. How to recognize symptoms of stress Symptoms of stress in caregivers include:  Feeling frustrated or angry with the person with  dementia.  Denying that the person has dementia or that his or her symptoms will not improve.  Feeling hopeless and unappreciated.  Difficulty sleeping.  Difficulty concentrating.  Feeling anxious, irritable, or depressed.  Developing stress-related health problems.  Feeling like you have too little time for your own life. Follow these instructions at home:   Make sure that you and the person you are caring for: ? Get regular sleep. ? Exercise regularly. ? Eat regular, nutritious meals. ? Drink enough fluid to keep your urine clear or pale yellow. ? Take over-the-counter and prescription medicines only as told by your health care providers. ? Attend all scheduled health care appointments.  Join a support group with others who are caregivers.  Ask about respite care resources so that you can have a regular break from the stress of caregiving.  Look for signs of stress in yourself and in the person you are caring for. If you notice signs of stress, take steps to manage it.  Consider any safety risks and take steps to avoid them.  Organize medications in a pill box for each day of the week.  Create a plan to handle any legal or financial matters. Get legal or financial advice if needed.  Keep a calendar in a central location to remind the person of appointments or other activities. Tips for reducing the risk of injury  Keep floors clear of clutter. Remove rugs, magazine racks, and floor lamps.  Keep hallways well lit, especially at night.  Put a handrail and nonslip mat in the bathtub or   shower.  Put childproof locks on cabinets that contain dangerous items, such as medicines, alcohol, guns, toxic cleaning items, sharp tools or utensils, matches, and lighters.  Put the locks in places where the person cannot see or reach them easily. This will help ensure that the person does not wander out of the house and get lost.  Be prepared for emergencies. Keep a list of  emergency phone numbers and addresses in a convenient area.  Remove car keys and lock garage doors so that the person does not try to get in the car and drive.  Have the person wear a bracelet that tracks locations and identifies the person as having memory problems. This should be worn at all times for safety. Where to find support: Many individuals and organizations offer support. These include:  Support groups for people with dementia and for caregivers.  Counselors or therapists.  Home health care services.  Adult day care centers. Where to find more information [employer]: [employer] Contact a health care provider if:  The person's health is rapidly getting worse.  You are no longer able to care for the person.  Caring for the person is affecting your physical and emotional health.  The person threatens himself or herself, you, or anyone else. Summary  Dementia is a term used to describe a number of symptoms that affect memory and thinking.  Dementia usually gets worse slowly over time.  Take steps to reduce the person's risk of injury, and to plan for future care.  Caregivers need support, relief from caregiving, and time for their own lives. This information is not intended to replace advice given to you by your health care provider. Make sure you discuss any questions you have with your health care provider. Document Released: 12/14/2015 Document Revised: 12/14/2015 Document Reviewed: 12/14/2015 Elsevier Interactive Patient Education  2019 [employer] Dementia Dementia is the loss of two or more brain functions, such as:  Memory.  Decision making.  Behavior.  Speaking.  Thinking.  Problem solving. There are many types of dementia. The most common type is called progressive dementia. Progressive dementia gets worse with time and it is irreversible. An example of this type of dementia is Alzheimer disease. What are the causes? This  condition may be caused by:  Nerve cell damage in the brain.  Genetic mutations.  Certain medicines.  Multiple small strokes.  An infection, such as chronic meningitis.  A metabolic problem, such as vitamin B12 deficiency or thyroid disease.  Pressure on the brain, such as from a tumor or blood clot. What are the signs or symptoms? Symptoms of this condition include:  Sudden changes in mood.  Depression.  Problems with balance.  Changes in personality.  Poor short-term memory.  Agitation.  Delusions.  Hallucinations.  Having a hard time: ? Speaking thoughts. ? Finding words. ? Solving problems. ? Doing familiar tasks. ? Understanding familiar ideas. How is this diagnosed? This condition is diagnosed with an assessment by your health care provider. During this assessment, your health care provider will talk with you and your family, friends, or caregivers about your symptoms. A thorough medical history will be taken, and you will have a physical exam and tests. Tests may include:  Lab tests, such as blood or urine tests.  Imaging tests, such as a CT scan, PET scan, or MRI.  A lumbar puncture. This test involves removing and testing a small amount of the fluid that surrounds the brain and spinal cord.  An electroencephalogram (  EEG). In this test, small metal discs are used to measure electrical activity in the brain.  Memory tests, cognitive tests, and neuropsychological tests. These tests evaluate brain function. How is this treated? Treatment depends on the cause of the dementia. It may involve taking medicines that may help:  To control the dementia.  To slow down the disease.  To manage symptoms. In some cases, treating the cause of the dementia can improve symptoms, reverse symptoms, or slow down how quickly the dementia gets worse. Your health care provider can help direct you to support groups, organizations, and other health care providers who can  help with decisions about your care. Follow these instructions at home: Medicine  Take over-the-counter and prescription medicines only as told by your health care provider.  Avoid taking medicines that can affect thinking, such as pain or sleeping medicines. Lifestyle   Make healthy lifestyle choices: ? Be physically active as told by your health care provider. ? Do not use any tobacco products, such as cigarettes, chewing tobacco, and e-cigarettes. If you need help quitting, ask your health care provider. ? Eat a healthy diet. ? Practice stress-management techniques when you get stressed. ? Stay social.  Drink enough fluid to keep your urine clear or pale yellow.  Make sure to get quality sleep. These tips can help you to get a good night's rest: ? Avoid napping during the day. ? Keep your sleeping area dark and cool. ? Avoid exercising during the few hours before you go to bed. ? Avoid caffeine products in the evening. General instructions  Work with your health care provider to determine what you need help with and what your safety needs are.  If you were given a bracelet that tracks your location, make sure to wear it.  Keep all follow-up visits as told by your health care provider. This is important. Contact a health care provider if:  You have any new symptoms.  You have problems with choking or swallowing.  You have any symptoms of a different illness. Get help right away if:  You develop a fever.  You have new or worsening confusion.  You have new or worsening sleepiness.  You have a hard time staying awake.  You or your family members become concerned for your safety. This information is not intended to replace advice given to you by your health care provider. Make sure you discuss any questions you have with your health care provider. Document Released: 07/05/2000 Document Revised: 05/20/2015 Document Reviewed: 10/07/2014 Elsevier Interactive Patient  Education  [employer].

## 2018-02-12 NOTE — Assessment & Plan Note (Signed)
Chronic, ongoing.  Continue current medication regimen.   

## 2018-02-12 NOTE — Progress Notes (Signed)
BP 130/72 (BP Location: Left Arm, Patient Position: Sitting, Cuff Size: Normal)   Pulse 68   Temp 98 F (36.7 C)   Ht 5' (1.524 m)   Wt 113 lb 8 oz (51.5 kg)   LMP  (LMP Unknown)   SpO2 98%   BMI 22.17 kg/m    Subjective:    Patient ID: Christine Burch, female    DOB: [birth year], 78 y.o.   MRN: [id number]  HPI: Christine Burch is a 78 y.o. female presents for follow-up  Chief Complaint  Patient presents with  . Hyperlipidemia  . Gastroesophageal Reflux   DEMENTIA: Chronic, progressive.  Continues to be cared for by her husband, who continues to report "I feel I can do a better job caring for her" when discussions of having help in home are brought forward.  He reports she does continue to have occasional falls, had a fall 3 days ago in the shower without injury.  He reports that they both it two meals a day, stating it takes 1 1/2 to 2 hours to help her eat meals.  Discussed her slight weight decrease and recommended use of daily supplement, Ensure, twice a day.  He does report difficulty with affording supplements, is agreeable to having referral for assistance in obtaining supplement.  He reports she continues to sleep often and has no behaviors.  Have discussed at length with him about getting assistance in home to help him, even a couple times a week.  At this time he reports no interest in this.  Is agreeable to social work consult when chronic care management in house.  Will continue to review dementia trajectory with him.  Have encouraged him to bring his daughter to patient's next appointment.  HYPERLIPIDEMIA Hyperlipidemia status: good compliance Satisfied with current treatment?  yes Side effects:  no Medication compliance: good compliance Aspirin:  yes The 10-year ASCVD risk score Mikey Bussing DC Jr., et al., 2013) is: 16.6%   Values used to calculate the score:     Age: 89 years     Sex: Female     Is Non-Hispanic African American: No     Diabetic: No     Tobacco smoker: No    Systolic Blood Pressure: 850 mmHg     Is BP treated: No     HDL Cholesterol: 45 mg/dL     Total Cholesterol: 218 mg/dL Chest pain:  no Coronary artery disease:  no Family history CAD:  no Family history early CAD:  no   GERD GERD control status: controlled  Satisfied with current treatment? yes Heartburn frequency:  Medication side effects: no  Medication compliance: stable Previous GERD medications: Antacid use frequency:   Duration:  Nature:  Location:  Heartburn duration:  Alleviatiating factors:   Aggravating factors:  Dysphagia: no Odynophagia:  no Hematemesis: no Blood in stool: no EGD: no   Relevant past medical, surgical, family and social history reviewed and updated as indicated. Interim medical history since our last visit reviewed. Allergies and medications reviewed and updated.  Review of Systems  Constitutional: Negative for activity change, appetite change, diaphoresis, fatigue and fever.  Respiratory: Negative for cough, chest tightness and shortness of breath.   Cardiovascular: Negative for chest pain, palpitations and leg swelling.  Gastrointestinal: Negative for abdominal distention, abdominal pain, constipation, diarrhea, nausea and vomiting.  Endocrine: Negative for cold intolerance, heat intolerance, polydipsia, polyphagia and polyuria.  Neurological: Negative for dizziness, syncope, weakness, light-headedness, numbness and headaches.  Psychiatric/Behavioral: Negative.  Per HPI unless specifically indicated above     Objective:    BP 130/72 (BP Location: Left Arm, Patient Position: Sitting, Cuff Size: Normal)   Pulse 68   Temp 98 F (36.7 C)   Ht 5' (1.524 m)   Wt 113 lb 8 oz (51.5 kg)   LMP  (LMP Unknown)   SpO2 98%   BMI 22.17 kg/m   Wt Readings from Last 3 Encounters:  02/12/18 113 lb 8 oz (51.5 kg)  11/23/17 116 lb 12.8 oz (53 kg)  10/24/17 117 lb 8 oz (53.3 kg)    Physical Exam Vitals signs and nursing note reviewed.    Constitutional:      Appearance: She is well-developed.     Comments: Lethargic per baseline, awakes to verbal and tactile stimuli with short words spoken and smiling.  HENT:     Head: Normocephalic.     Right Ear: Hearing normal.     Left Ear: Hearing normal.     Nose: Nose normal.     Mouth/Throat:     Mouth: Mucous membranes are moist.  Eyes:     General: Lids are normal.        Right eye: No discharge.        Left eye: No discharge.     Conjunctiva/sclera: Conjunctivae normal.     Pupils: Pupils are equal, round, and reactive to light.  Neck:     Musculoskeletal: Normal range of motion and neck supple.     Thyroid: No thyromegaly.     Vascular: No carotid bruit or JVD.  Cardiovascular:     Rate and Rhythm: Normal rate and regular rhythm.     Heart sounds: Normal heart sounds. No murmur. No gallop.   Pulmonary:     Effort: Pulmonary effort is normal.     Breath sounds: Normal breath sounds.  Abdominal:     General: Bowel sounds are normal.     Palpations: Abdomen is soft. There is no hepatomegaly or splenomegaly.  Musculoskeletal:     Right lower leg: No edema.     Left lower leg: No edema.  Lymphadenopathy:     Cervical: No cervical adenopathy.  Skin:    General: Skin is warm and dry.  Neurological:     Mental Status: She is oriented to person, place, and time. She is lethargic.     Comments: Shortened steps, with steady gait with assist from husband.  Psychiatric:        Attention and Perception: Attention normal.        Mood and Affect: Mood normal.        Behavior: Behavior normal. Behavior is cooperative.        Thought Content: Thought content normal.        Judgment: Judgment normal.     Results for orders placed or performed in visit on 11/23/17  Lipid Profile  Result Value Ref Range   Cholesterol, Total 218 (H) 100 - 199 mg/dL   Triglycerides 207 (H) 0 - 149 mg/dL   HDL 45 >39 mg/dL   VLDL Cholesterol Cal 41 (H) 5 - 40 mg/dL   LDL Calculated 132  (H) 0 - 99 mg/dL   Chol/HDL Ratio 4.8 (H) 0.0 - 4.4 ratio  Comp Met (CMET)  Result Value Ref Range   Glucose 92 65 - 99 mg/dL   BUN 13 8 - 27 mg/dL   Creatinine, Ser 0.92 0.57 - 1.00 mg/dL   GFR calc non Af Amer 61 >  59 mL/min/1.73   GFR calc Af Amer 70 >59 mL/min/1.73   BUN/Creatinine Ratio 14 12 - 28   Sodium 141 134 - 144 mmol/L   Potassium 4.0 3.5 - 5.2 mmol/L   Chloride 103 96 - 106 mmol/L   CO2 24 20 - 29 mmol/L   Calcium 9.8 8.7 - 10.3 mg/dL   Total Protein 6.8 6.0 - 8.5 g/dL   Albumin 3.9 3.5 - 4.8 g/dL   Globulin, Total 2.9 1.5 - 4.5 g/dL   Albumin/Globulin Ratio 1.3 1.2 - 2.2   Bilirubin Total 0.6 0.0 - 1.2 mg/dL   Alkaline Phosphatase 57 39 - 117 IU/L   AST 19 0 - 40 IU/L   ALT 16 0 - 32 IU/L  CBC w/Diff  Result Value Ref Range   WBC 6.2 3.4 - 10.8 x10E3/uL   RBC 4.70 3.77 - 5.28 x10E6/uL   Hemoglobin 12.9 11.1 - 15.9 g/dL   Hematocrit 38.9 34.0 - 46.6 %   MCV 83 79 - 97 fL   MCH 27.4 26.6 - 33.0 pg   MCHC 33.2 31.5 - 35.7 g/dL   RDW 13.5 12.3 - 15.4 %   Platelets 288 150 - 450 x10E3/uL   Neutrophils 68 Not Estab. %   Lymphs 19 Not Estab. %   Monocytes 10 Not Estab. %   Eos 2 Not Estab. %   Basos 1 Not Estab. %   Neutrophils Absolute 4.2 1.4 - 7.0 x10E3/uL   Lymphocytes Absolute 1.2 0.7 - 3.1 x10E3/uL   Monocytes Absolute 0.6 0.1 - 0.9 x10E3/uL   EOS (ABSOLUTE) 0.1 0.0 - 0.4 x10E3/uL   Basophils Absolute 0.1 0.0 - 0.2 x10E3/uL   Immature Granulocytes 0 Not Estab. %   Immature Grans (Abs) 0.0 0.0 - 0.1 x10E3/uL      Assessment & Plan:   Problem List Items Addressed This Visit      Digestive   GERD (gastroesophageal reflux disease)    Chronic, ongoing.  Continue current medication regimen.          Nervous and Auditory   Dementia ([place]) - Primary    Chronic, progressive.  No memory care medications.  Continue to discuss trajectory and encourage assistance in home.  [facility]cted [facility] consult to assist in getting dietary supplements in home.       Relevant Orders   Ambulatory referral to Conne[facility]     Other   High cholesterol    Chronic, ongoing.  Continue current medication regimen.            Follow up plan: Return in about 1 month (around 03/15/2018), or Dementia.

## 2018-03-04 ENCOUNTER — Telehealth: Payer: Self-pay | Admitting: Nurse Practitioner

## 2018-03-06 NOTE — Telephone Encounter (Signed)
Closing referral.

## 2018-03-19 ENCOUNTER — Ambulatory Visit: Payer: Self-pay | Admitting: *Deleted

## 2018-03-19 ENCOUNTER — Other Ambulatory Visit: Payer: Self-pay

## 2018-03-19 ENCOUNTER — Ambulatory Visit (INDEPENDENT_AMBULATORY_CARE_PROVIDER_SITE_OTHER): Payer: Medicare Other | Admitting: Nurse Practitioner

## 2018-03-19 ENCOUNTER — Encounter: Payer: Self-pay | Admitting: Nurse Practitioner

## 2018-03-19 VITALS — BP 132/75 | HR 80 | Temp 97.4°F | Ht 60.0 in | Wt 110.4 lb

## 2018-03-19 DIAGNOSIS — F0390 Unspecified dementia without behavioral disturbance: Secondary | ICD-10-CM

## 2018-03-19 DIAGNOSIS — N182 Chronic kidney disease, stage 2 (mild): Secondary | ICD-10-CM

## 2018-03-19 DIAGNOSIS — R269 Unspecified abnormalities of gait and mobility: Secondary | ICD-10-CM

## 2018-03-19 NOTE — Assessment & Plan Note (Signed)
Chronic, progressive.  No memory care medications.  Continue ongoing discussions about disease trajectory.  Continue to have slight decline in weight, have recommended increasing supplement to twice a day.  Placed CCM consult and husband spoke to CCM team at bedside today.

## 2018-03-19 NOTE — Progress Notes (Signed)
BP 132/75 (BP Location: Left Arm, Patient Position: Sitting, Cuff Size: Normal)   Pulse 80   Temp (!) 97.4 F (36.3 C) (Oral)   Ht 5' (1.524 m)   Wt 110 lb 6 oz (50.1 kg)   LMP  (LMP Unknown)   SpO2 99%   BMI 21.56 kg/m    Subjective:    Patient ID: Christine Burch, female    DOB: [birth year], 78 y.o.   MRN: [id number]  HPI: Christine Burch is a 78 y.o. female  Chief Complaint  Patient presents with  . Dementia    follow up   DEMENTIA: Chronic, progressive.  Continues to be cared for by her husband, who continues to report "I feel I can do it on my own" when discussions of having help in home are brought forward.  He reports she does continue to have occasional falls, had a fall 2 weeks ago ago in [place] without injury.  He reports that they both it two meals a day + he is now providing her supplement protein shakes as recommended during last visit. She is drinking 1 shake per day with 30 grams of protein- Discussed her slight weight decrease and recommended increasing supplemental shake to twice per day. He does report difficulty with affording supplements, is agreeable to having referral for assistance in obtaining supplement. He reports she continues to sleep often and has no behaviors. He reports that antidepressant medication was stopped about 2 months ago, with no change in daytime sleepiness. Have discussed at length with him about getting assistance in home to help him, even a couple times a week again on this visit. At this time he continues to have no interest in this. Is interested in social work consult with chronic care management in house and by telephone. Has agreed to have his daughter included with this service, as well. Will continue to review dementia trajectory with him. Have encouraged him to bring his daughter to patient's next appointment.   Relevant past medical, surgical, family and social history reviewed and updated as indicated. Interim medical history since our  last visit reviewed. Allergies and medications reviewed and updated.  Review of Systems  Constitutional: Negative for activity change, appetite change, diaphoresis, fatigue and fever.  Respiratory: Negative for cough, chest tightness and shortness of breath.   Cardiovascular: Negative for chest pain, palpitations and leg swelling.  Gastrointestinal: Negative for abdominal distention, abdominal pain, constipation, diarrhea, nausea and vomiting.  Endocrine: Negative for cold intolerance, heat intolerance, polydipsia, polyphagia and polyuria.  Neurological: Negative for dizziness, syncope, weakness, light-headedness, numbness and headaches.  Psychiatric/Behavioral: Negative for behavioral problems. The patient is not nervous/anxious.     Per HPI unless specifically indicated above     Objective:    BP 132/75 (BP Location: Left Arm, Patient Position: Sitting, Cuff Size: Normal)   Pulse 80   Temp (!) 97.4 F (36.3 C) (Oral)   Ht 5' (1.524 m)   Wt 110 lb 6 oz (50.1 kg)   LMP  (LMP Unknown)   SpO2 99%   BMI 21.56 kg/m   Wt Readings from Last 3 Encounters:  03/19/18 110 lb 6 oz (50.1 kg)  02/12/18 113 lb 8 oz (51.5 kg)  11/23/17 116 lb 12.8 oz (53 kg)    Physical Exam Vitals signs and nursing note reviewed.  Constitutional:      General: She is awake.     Appearance: She is well-developed.     Comments: Increase alertness during exam today, eating chocolate  bar and smiling.  Simple sentences only "I love you".    HENT:     Head: Normocephalic.  Eyes:     General:        Right eye: No discharge.        Left eye: No discharge.     Conjunctiva/sclera: Conjunctivae normal.     Pupils: Pupils are equal, round, and reactive to light.  Neck:     Musculoskeletal: Normal range of motion and neck supple.     Thyroid: No thyromegaly.     Vascular: No carotid bruit or JVD.  Cardiovascular:     Rate and Rhythm: Normal rate and regular rhythm.     Heart sounds: Normal heart sounds.    Pulmonary:     Effort: Pulmonary effort is normal.     Breath sounds: Normal breath sounds.  Abdominal:     General: Bowel sounds are normal.     Palpations: Abdomen is soft.     Comments: Swallowing without difficulty, chocolate bar, today.  Lymphadenopathy:     Cervical: No cervical adenopathy.  Skin:    General: Skin is warm and dry.  Neurological:     Mental Status: She is alert and oriented to person, place, and time.     Deep Tendon Reflexes: Reflexes are normal and symmetric.  Psychiatric:        Mood and Affect: Mood normal.        Behavior: Behavior normal. Behavior is cooperative.        Thought Content: Thought content normal.        Judgment: Judgment normal.     Comments: More interactive with simple sentences only on exam today.    Results for orders placed or performed in visit on 11/23/17  Lipid Profile  Result Value Ref Range   Cholesterol, Total 218 (H) 100 - 199 mg/dL   Triglycerides 207 (H) 0 - 149 mg/dL   HDL 45 >39 mg/dL   VLDL Cholesterol Cal 41 (H) 5 - 40 mg/dL   LDL Calculated 132 (H) 0 - 99 mg/dL   Chol/HDL Ratio 4.8 (H) 0.0 - 4.4 ratio  Comp Met (CMET)  Result Value Ref Range   Glucose 92 65 - 99 mg/dL   BUN 13 8 - 27 mg/dL   Creatinine, Ser 0.92 0.57 - 1.00 mg/dL   GFR calc non Af Amer 61 >59 mL/min/1.73   GFR calc Af Amer 70 >59 mL/min/1.73   BUN/Creatinine Ratio 14 12 - 28   Sodium 141 134 - 144 mmol/L   Potassium 4.0 3.5 - 5.2 mmol/L   Chloride 103 96 - 106 mmol/L   CO2 24 20 - 29 mmol/L   Calcium 9.8 8.7 - 10.3 mg/dL   Total Protein 6.8 6.0 - 8.5 g/dL   Albumin 3.9 3.5 - 4.8 g/dL   Globulin, Total 2.9 1.5 - 4.5 g/dL   Albumin/Globulin Ratio 1.3 1.2 - 2.2   Bilirubin Total 0.6 0.0 - 1.2 mg/dL   Alkaline Phosphatase 57 39 - 117 IU/L   AST 19 0 - 40 IU/L   ALT 16 0 - 32 IU/L  CBC w/Diff  Result Value Ref Range   WBC 6.2 3.4 - 10.8 x10E3/uL   RBC 4.70 3.77 - 5.28 x10E6/uL   Hemoglobin 12.9 11.1 - 15.9 g/dL   Hematocrit 38.9 34.0 -  46.6 %   MCV 83 79 - 97 fL   MCH 27.4 26.6 - 33.0 pg   MCHC 33.2 31.5 - 35.7 g/dL  RDW 13.5 12.3 - 15.4 %   Platelets 288 150 - 450 x10E3/uL   Neutrophils 68 Not Estab. %   Lymphs 19 Not Estab. %   Monocytes 10 Not Estab. %   Eos 2 Not Estab. %   Basos 1 Not Estab. %   Neutrophils Absolute 4.2 1.4 - 7.0 x10E3/uL   Lymphocytes Absolute 1.2 0.7 - 3.1 x10E3/uL   Monocytes Absolute 0.6 0.1 - 0.9 x10E3/uL   EOS (ABSOLUTE) 0.1 0.0 - 0.4 x10E3/uL   Basophils Absolute 0.1 0.0 - 0.2 x10E3/uL   Immature Granulocytes 0 Not Estab. %   Immature Grans (Abs) 0.0 0.0 - 0.1 x10E3/uL      Assessment & Plan:   Problem List Items Addressed This Visit      Nervous and Auditory   Dementia (HCC) - Primary    Chronic, progressive.  No memory care medications.  Continue ongoing discussions about disease trajectory.  Continue to have slight decline in weight, have recommended increasing supplement to twice a day.  Placed CCM consult and husband spoke to CCM team at bedside today.      Relevant Orders   Ambulatory referral to Chronic Care Management Services       Follow up plan: Return in about 1 month (around 04/17/2018) for Dementia.

## 2018-03-19 NOTE — Patient Instructions (Signed)
Dementia Caregiver Guide Dementia is a term used to describe a number of symptoms that affect memory and thinking. The most common symptoms include:  Memory loss.  Trouble with language and communication.  Trouble concentrating.  Poor judgment.  Problems with reasoning.  Child-like behavior and language.  Extreme anxiety.  Angry outbursts.  Wandering from home or public places. Dementia usually gets worse slowly over time. In the early stages, people with dementia can stay independent and safe with some help. In later stages, they need help with daily tasks such as dressing, grooming, and using the bathroom. How to help the person with dementia cope Dementia can be frightening and confusing. Here are some tips to help the person with dementia cope with changes caused by the disease. General tips  Keep the person on track with his or her routine.  Try to identify areas where the person may need help.  Be supportive, patient, calm, and encouraging.  Gently remind the person that adjusting to changes takes time.  Help with the tasks that the person has asked for help with.  Keep the person involved in daily tasks and decisions as much as possible.  Encourage conversation, but try not to get frustrated or harried if the person struggles to find words or does not seem to appreciate your help. Communication tips  When the person is talking or seems frustrated, make eye contact and hold the person's hand.  Ask specific questions that need yes or no answers.  Use simple words, short sentences, and a calm voice. Only give one direction at a time.  When offering choices, limit them to just 1 or 2.  Avoid correcting the person in a negative way.  If the person is struggling to find the right words, gently try to help him or her. How to recognize symptoms of stress Symptoms of stress in caregivers include:  Feeling frustrated or angry with the person with dementia.   Denying that the person has dementia or that his or her symptoms will not improve.  Feeling hopeless and unappreciated.  Difficulty sleeping.  Difficulty concentrating.  Feeling anxious, irritable, or depressed.  Developing stress-related health problems.  Feeling like you have too little time for your own life. Follow these instructions at home:   Make sure that you and the person you are caring for: ? Get regular sleep. ? Exercise regularly. ? Eat regular, nutritious meals. ? Drink enough fluid to keep your urine clear or pale yellow. ? Take over-the-counter and prescription medicines only as told by your health care providers. ? Attend all scheduled health care appointments.  Join a support group with others who are caregivers.  Ask about respite care resources so that you can have a regular break from the stress of caregiving.  Look for signs of stress in yourself and in the person you are caring for. If you notice signs of stress, take steps to manage it.  Consider any safety risks and take steps to avoid them.  Organize medications in a pill box for each day of the week.  Create a plan to handle any legal or financial matters. Get legal or financial advice if needed.  Keep a calendar in a central location to remind the person of appointments or other activities. Tips for reducing the risk of injury  Keep floors clear of clutter. Remove rugs, magazine racks, and floor lamps.  Keep hallways well lit, especially at night.  Put a handrail and nonslip mat in the bathtub or   shower.  Put childproof locks on cabinets that contain dangerous items, such as medicines, alcohol, guns, toxic cleaning items, sharp tools or utensils, matches, and lighters.  Put the locks in places where the person cannot see or reach them easily. This will help ensure that the person does not wander out of the house and get lost.  Be prepared for emergencies. Keep a list of emergency phone  numbers and addresses in a convenient area.  Remove car keys and lock garage doors so that the person does not try to get in the car and drive.  Have the person wear a bracelet that tracks locations and identifies the person as having memory problems. This should be worn at all times for safety. Where to find support: Many individuals and organizations offer support. These include:  Support groups for people with dementia and for caregivers.  Counselors or therapists.  Home health care services.  Adult day care centers. Where to find more information Alzheimer's Association: www.alz.org Contact a health care provider if:  The person's health is rapidly getting worse.  You are no longer able to care for the person.  Caring for the person is affecting your physical and emotional health.  The person threatens himself or herself, you, or anyone else. Summary  Dementia is a term used to describe a number of symptoms that affect memory and thinking.  Dementia usually gets worse slowly over time.  Take steps to reduce the person's risk of injury, and to plan for future care.  Caregivers need support, relief from caregiving, and time for their own lives. This information is not intended to replace advice given to you by your health care provider. Make sure you discuss any questions you have with your health care provider. Document Released: 12/14/2015 Document Revised: 12/14/2015 Document Reviewed: 12/14/2015 Elsevier Interactive Patient Education  2019 [employer]vier Inc[employer]  

## 2018-03-19 NOTE — Chronic Care Management (AMB) (Signed)
  Chronic Care Management   Face to Face Consult Note  03/19/2018 Name: Christine Burch MRN: [id number] DOB: 1940-07-06  Referred by: Venita Lick, NP Reason for referral : Chronic Care Management (New Referral CCM)  Christine Burch is a 78 y.o. year old female who sees [place], [name] T, NP for primary care. Christine Burch asked the CCM team to consult the patient for assistance with chronic disease management related to dementia care support and resources. I met with Christine Burch and her husband/primary caregiver today, face to face.    Christine Burch, primary caregiver and spouse of Ms. Burch was given information about Chronic Care Management services today including:  1. CCM service includes personalized support from designated clinical staff supervised by her physician, including individualized plan of care and coordination with other care providers 2. 24/7 contact phone numbers for assistance for urgent and routine care needs. 3. Service will only be billed when office clinical staff spend 20 minutes or more in a month to coordinate care. 4. Only one practitioner may furnish and bill the service in a calendar month. 5. The patient may stop CCM services at any time (effective at the end of the month) by phone call to the office staff. 6. The patient will be responsible for cost sharing (co-pay) of up to 20% of the service fee (after annual deductible is met).  Patient agreed to services and verbal consent obtained.    Goals Addressed    . "I could use help with taking care of her" (spouse)       Current Barriers:  Marland Kitchen Knowledge Deficits related to available resources for assistance with dementia care. Christine Burch caregiver support. - patients spouse is primary caregiver; daughter supportive; spouse requests assistance with additional hands on caregiver support  Nurse Case Manager Clinical Goal(s):  Marland Kitchen Over the next 30 days, patient will meet with RN Care Manager to address dementia care support  and resources  Interventions:  . Evaluation of current treatment plan related to dementia and general plan of care by PCP . Collaborated with LCSW  regarding dementia care and support resources . Collaborated with PCP re: patient care needs  Patient Self Care Activities:  . Currently unable to independently perform ADL's or IADL's* . Primary caregiver/spouse calls provider office for new concerns or questions . Attends all scheduled provider appointments (managed by spouse/primary caregiver)  Plan:  . RNCM will collaborate with social work team to discuss dementia care support and resources   *initial goal documentation      Follow Up Plan: RNCM will collaborate with LCSW colleague re: Dementia Care support and resources and will follow up with patient's spouse/primary caregiver by phone.   Janalyn Shy MHA,BSN,RN,CCM Nurse Care Coordinator [facility] / [facility] Care Management [phone]

## 2018-03-19 NOTE — Patient Instructions (Signed)
Visit Information  Goals    . "I could use help with taking care of her" (spouse)     Current Barriers:  Marland Kitchen Knowledge Deficits related to available resources for assistance with dementia care. Leodis [place] caregiver support. - patients spouse is primary caregiver; daughter supportive; spouse requests assistance with additional hands on caregiver support  Nurse Case Manager Clinical Goal(s):  Marland Kitchen Over the next 30 days, patient will meet with RN Care Manager to address dementia care support and resources  Interventions:  . Evaluation of current treatment plan related to dementia and general plan of care by PCP . Collaborated with LCSW  regarding dementia care and support resources . Collaborated with PCP re: patient care needs  Patient Self Care Activities:  . Currently unable to independently perform ADL's or IADL's* . Primary caregiver/spouse calls provider office for new concerns or questions . Attends all scheduled provider appointments (managed by spouse/primary caregiver)  Plan:  . RNCM will collaborate with social work team to discuss dementia care support and resources   *initial goal documentation     . DIET - INCREASE WATER INTAKE     Recommend drinking at least 6-8 glasses of water a day     . Have 3 meals a day     Recommend eating 3 healthy meals a day        Education or Materials Provided:  CCM information  Mr. Hattabaugh, spouse and primary caregiver for Ms. Diego was given information about Chronic Care Management services today including:  1. CCM service includes personalized support from designated clinical staff supervised by her physician, including individualized plan of care and coordination with other care providers 2. 24/7 contact phone numbers for assistance for urgent and routine care needs. 3. Service will only be billed when office clinical staff spend 20 minutes or more in a month to coordinate care. 4. Only one practitioner may furnish and bill the  service in a calendar month. 5. The patient may stop CCM services at any time (effective at the end of the month) by phone call to the office staff. 6. The patient will be responsible for cost sharing (co-pay) of up to 20% of the service fee (after annual deductible is met).  Spouse, on behalf of patient agreed to services and verbal consent obtained.   The patient verbalized understanding of instructions provided today and declined a print copy of patient instruction materials.   Telephone follow up appointment with CCM team member scheduled for: next 19 hours  [place] Coordinator [facility] / [place] Management [phone]

## 2018-03-21 ENCOUNTER — Ambulatory Visit: Payer: Medicare Other | Admitting: *Deleted

## 2018-03-21 DIAGNOSIS — F0390 Unspecified dementia without behavioral disturbance: Secondary | ICD-10-CM

## 2018-03-21 NOTE — Chronic Care Management (AMB) (Signed)
  Chronic Care Management   Follow Up Note   03/21/2018 Name: Christine Burch MRN: [id number] DOB: 04-Jan-1941  Referred by: Venita Lick, NP Reason for referral : Care Coordination (F/u In home care needs)  Subjective: "I want to keep taking care of her here at home as long as I can"  Assessment: Christine Burch is a 78 y.o. year old female who sees [place], [name] T, NP for primary care. Christine Burch asked the CCM team to consult the patient for assistance with chronic disease management related to dementia care support and resources.   Goals Addressed    . "I could use help with taking care of her" (spouse)       Current Barriers:  Marland Kitchen Knowledge Deficits related to available resources for assistance with dementia care. Leodis [place] caregiver support. - patients spouse is primary caregiver; daughter supportive; spouse requests assistance with additional hands on caregiver support  Nurse Case Manager Clinical Goal(s):  Marland Kitchen Over the next 30 days, patient will meet with RN Care Manager to address dementia care support and resources  Interventions:  . Evaluation of current treatment plan related to dementia and general plan of care by PCP . Collaborated with LCSW  regarding dementia care and support resources . Collaborated with PCP re: patient care needs . Returned call to patient re: various agencies/resources in the area who can provide services requested to include: [facility] [phone], [facility] [phone], [place] [facility] [phone], [facility] [phone], [facility] [phone]       These agencies can provide assistance with activities of daily living, bathing, toileting,light meal           Preparation, transportation, errands, shopping, and light housekeeping, I offered to patient's            spouse that he may choose          on behalf of his wife any agency he wishes. I provided information given to me by agencies related to details        of referral process and  care options.     Plan:  . RNCM will continue to collaborate with social work team to discuss options for dementia care, support and resources and will follow up with patient's spouse/primary caregiver to assess for ongoing needs and changes   Please see past updates related to this goal by clicking on the "Past Updates" button in the selected goal        Telephone follow up appointment with CCM team member scheduled for: 04/01/18  Janalyn Shy [place] Nurse Care Coordinator [facility] [place] [facility] [phone]

## 2018-03-25 ENCOUNTER — Ambulatory Visit: Payer: Medicare Other | Admitting: *Deleted

## 2018-03-25 DIAGNOSIS — F0390 Unspecified dementia without behavioral disturbance: Secondary | ICD-10-CM

## 2018-03-25 DIAGNOSIS — R269 Unspecified abnormalities of gait and mobility: Secondary | ICD-10-CM

## 2018-03-25 NOTE — Patient Instructions (Signed)
Visit Information  Goals Addressed            This Visit's Progress   . "I could use help with taking care of her" (spouse) (pt-stated)       Current Barriers:  Marland Kitchen Knowledge Deficits related to available resources for assistance with dementia care. Leodis [place] caregiver support. - patients spouse is primary caregiver; daughter supportive; spouse requests assistance with additional hands on caregiver support  Nurse Case Manager Clinical Goal(s):  Marland Kitchen Over the next 30 days, patient will meet with RN Care Manager to address dementia care support and resources  Interventions:  . Evaluation of current treatment plan related to dementia and general plan of care by PCP . Received call from patient's spouse/proxy/primary caregiver who asked me to speak with dtr [place]  [phone] cell re: [facility] services for Mrs. Jasinski. I provided information about services which can include: assistance with activities of daily living, bathing, toileting,light meal preparation,         transportation, errands, shopping, and light housekeeping.   Self Care Activities:   . Currently unable to independently perform ADL's or IADL's* . Primary caregiver/spouse calls provider office for new concerns or questions . Attends all scheduled provider appointments (managed by spouse/primary caregiver)  Plan:  . RNCM will continue to collaborate with social work team to discuss options for dementia care, support and resources and will follow up with patient's spouse/primary caregiver to assess for ongoing needs and changes   Please see past updates related to this goal by clicking on the "Past Updates" button in the selected goal         Education or Materials Provided:  . Verbal education about in home care services provided by phone  The patient verbalized understanding of instructions provided today and declined a print copy of patient instruction materials.   The CM team will reach out to the patient  again over the next 7 days.   Janalyn Shy MHA,BSN,RN,CCM Nurse Care Coordinator [facility] Care Management [phone]

## 2018-03-25 NOTE — Chronic Care Management (AMB) (Signed)
  Chronic Care Management   Follow Up Note   03/25/2018 Name: [name] MRN: [id number] DOB: Jan 08, 1941  Referred by: Venita Lick, NP Reason for referral : Care Coordination (telephone follow up re: in home care services)   Christine Burch is a 78 y.o. year old female who is a primary care patient of Cannady, Barbaraann Faster, NP. The CCM team was consulted for assistance with chronic disease management and care coordination needs.    Review of patient status, including review of consultants reports, relevant laboratory and other test results, and collaboration with appropriate care team members and the patient's provider was performed as part of comprehensive patient evaluation and provision of chronic care management services.    Goals Addressed            This Visit's Progress   . "I could use help with taking care of her" (spouse) (pt-stated)       Current Barriers:  Marland Kitchen Knowledge Deficits related to available resources for assistance with dementia care. Leodis [place] caregiver support. - patients spouse is primary caregiver; daughter supportive; spouse requests assistance with additional hands on caregiver support  Nurse Case Manager Clinical Goal(s):  Marland Kitchen Over the next 30 days, patient will meet with RN Care Manager to address dementia care support and resources  Interventions:  . Evaluation of current treatment plan related to dementia and general plan of care by PCP . Received call from patient's spouse/proxy/primary caregiver who asked me to speak with dtr [place]  [phone] cell re: Home Instead services for Mrs. Hachey. I provided information about services which can include: assistance with activities of daily living, bathing, toileting,light meal preparation,         transportation, errands, shopping, and light housekeeping.   Self Care Activities:   . Currently unable to independently perform ADL's or IADL's* . Primary caregiver/spouse calls provider office for new  concerns or questions . Attends all scheduled provider appointments (managed by spouse/primary caregiver)  Plan:  . RNCM will continue to collaborate with social work team to discuss options for dementia care, support and resources and will follow up with patient's spouse/primary caregiver to assess for ongoing needs and changes   Please see past updates related to this goal by clicking on the "Past Updates" button in the selected goal          The CM team will reach out to the patient again over the next 7 days.    Janalyn Shy MHA,BSN,RN,CCM Nurse Care Coordinator [facility] Care Management [phone]

## 2018-03-27 DIAGNOSIS — L03032 Cellulitis of left toe: Secondary | ICD-10-CM | POA: Diagnosis not present

## 2018-03-28 ENCOUNTER — Telehealth: Payer: Self-pay | Admitting: Nurse Practitioner

## 2018-03-28 NOTE — Telephone Encounter (Signed)
Spoke with pt's husband he will be by to pick up the Ensure samples that arrived.  Tiffany, he did not indicate a time will you please put Christine Burch name on the box and Ms. Chisom MRN in case they come after 4pm today you can leave with Santiago Glad?  Thank you, Jill Alexanders

## 2018-03-28 NOTE — Telephone Encounter (Signed)
Patients husband picked up the supplies

## 2018-04-01 ENCOUNTER — Telehealth: Payer: Self-pay

## 2018-04-05 ENCOUNTER — Other Ambulatory Visit: Payer: Self-pay | Admitting: Unknown Physician Specialty

## 2018-04-05 ENCOUNTER — Telehealth: Payer: Self-pay | Admitting: Nurse Practitioner

## 2018-04-05 ENCOUNTER — Ambulatory Visit: Payer: Self-pay | Admitting: *Deleted

## 2018-04-05 DIAGNOSIS — F0390 Unspecified dementia without behavioral disturbance: Secondary | ICD-10-CM

## 2018-04-05 DIAGNOSIS — Z7189 Other specified counseling: Secondary | ICD-10-CM

## 2018-04-05 NOTE — Chronic Care Management (AMB) (Signed)
  Chronic Care Management   Follow Up Note   04/05/2018 Name: Christine Burch MRN: [id number] DOB: 1940/04/30  Referred by: Venita Lick, NP Reason for referral : Chronic Care Management (Level of Care Concerns/In home care services/needs)  Christine Burch is a 78 y.o. year old female who is a primary care patient of Cannady, Barbaraann Faster, NP. The CCM team was consulted for assistance with chronic disease management and care coordination needs.    Review of patient status, including review of consultants reports, relevant laboratory and other test results, and collaboration with appropriate care team members and the patient's provider was performed as part of comprehensive patient evaluation and provision of chronic care management services.    Goals Addressed    . "I could use help with taking care of her" (spouse) (pt-stated)       Current Barriers:  Marland Kitchen Knowledge Deficits related to available resources for assistance with dementia care. - patient's spouse/caregiver called the office with concerns about patient "sleeping al the time" over the last 2 days; spouse/caregiver states he is able to provide needed care to the patient because "she really just sleeps all the time"; asks if we think "therapy would help perk her up"; stated he was concerned that the cause of her oversleeping might have been that he accidentally gave her one of his Lotensin HCT. He also was concerned that she had swallowed some GlyOxy given by her dentist and called poison control who advised that in such a small amount was likely not harmful and didn't need intervention or follow up.  . Limited caregiver support. - daughter is very supportive but works during the day; patient's spouse has declined in home care services thus far but states today that he might be willing to re-consider.   Nurse Case Manager Clinical Goal(s):  Marland Kitchen Over the next 30 days, patient will meet with RN Care Manager to address dementia care support and  resources  Interventions:  . Evaluation of current treatment plan related to dementia and general plan of care by PCP . Provided education about dementia symptom progression  . Advised patient to contact provider office for any symptoms/signs of infection (chills, fever, worsened confusion) or new or worsened symptoms  . Self Care Activities:  . Currently unable to independently perform ADL's or IADL's* . Primary caregiver/spouse calls provider office for new concerns or questions . Attends all scheduled provider appointments (managed by spouse/primary caregiver)  Plan:  . RNCM will continue to collaborate with social work team and PCP to discuss options for dementia care, support and resources and will follow up with patient's spouse/primary caregiver to assess for ongoing needs and changes   Please see past updates related to this goal by clicking on the "Past Updates" button in the selected goal          The CM team will reach out to the patient again over the next 3 days.    Janalyn Shy MHA,BSN,RN,CCM Nurse Care Coordinator [facility] Care Management [phone]

## 2018-04-05 NOTE — Telephone Encounter (Signed)
Pt's spouse, [name], would like to speak with [name] about this.  Pt has slept most of the day today and he is concerned about this.

## 2018-04-05 NOTE — Telephone Encounter (Signed)
Copied from [facility] [id number]. Topic: Referral - Request for Referral >> Apr 05, 2018 10:12 AM Margot Ables wrote: Has patient seen PCP for this complaint? Yes.   *If NO, is insurance requiring patient see PCP for this issue before PCP can refer them? Referral for which specialty: Home Health PT Preferred provider/office: Pt is wanting to have PT come to home.  Reason for referral: Mr. Nesheim needs help as he cannot get her in and out of the house and she is sleeping a lot. They had a hard time getting to outpatient PT 1x week previously and it is 20 miles away from their home.

## 2018-04-05 NOTE — Patient Instructions (Signed)
Visit Information  Goals Addressed            This Visit's Progress   . "I could use help with taking care of her" (spouse) (pt-stated)       Current Barriers:  Marland Kitchen Knowledge Deficits related to available resources for assistance with dementia care. - patient's spouse/caregiver called the office with concerns about patient "sleeping al the time" over the last 2 days; spouse/caregiver states he is able to provide needed care to the patient because "she really just sleeps all the time"; asks if we think "therapy would help perk her up"; stated he was concerned that the cause of her oversleeping might have been that he accidentally gave her one of his Lotensin HCT. He also was concerned that she had swallowed some GlyOxy given by her dentist and called poison control who advised that in such a small amount was likely not harmful and didn't need intervention or follow up.  . Limited caregiver support. - daughter is very supportive but works during the day; patient's spouse has declined in home care services thus far but states today that he might be willing to re-consider.   Nurse Case Manager Clinical Goal(s):  Marland Kitchen Over the next 30 days, patient will meet with RN Care Manager to address dementia care support and resources  Interventions:  . Evaluation of current treatment plan related to dementia and general plan of care by PCP . Provided education about dementia symptom progression  . Advised patient to contact provider office for any symptoms/signs of infection (chills, fever, worsened confusion) or new or worsened symptoms  . Self Care Activities:  . Currently unable to independently perform ADL's or IADL's* . Primary caregiver/spouse calls provider office for new concerns or questions . Attends all scheduled provider appointments (managed by spouse/primary caregiver)  Plan:  . RNCM will continue to collaborate with social work team and PCP to discuss options for dementia care, support and  resources and will follow up with patient's spouse/primary caregiver to assess for ongoing needs and changes   Please see past updates related to this goal by clicking on the "Past Updates" button in the selected goal         The patient verbalized understanding of instructions provided today and declined a print copy of patient instruction materials.   The CM team will reach out to the patient again over the next 3 days.  Janalyn Shy MHA,BSN,RN,CCM Nurse Care Coordinator [facility] Care Management [phone]

## 2018-04-05 NOTE — Telephone Encounter (Signed)
Please let him know I have contacted the chronic care management team and we will work on getting this in his house.  He should hear from one of their team members today or on Monday or Tuesday.

## 2018-04-05 NOTE — Telephone Encounter (Signed)
Requested Prescriptions  Pending Prescriptions Disp Refills  . ezetimibe (ZETIA) 10 MG tablet [Pharmacy Med Name: EZETIMIBE 10 MG TABLET] 90 tablet 3    Sig: TAKE 1 TABLET BY MOUTH EVERY DAY     Cardiovascular:  Antilipid - Sterol Transport Inhibitors Failed - 04/05/2018  8:30 AM      Failed - Total Cholesterol in normal range and within 360 days    Cholesterol, Total  Date Value Ref Range Status  11/23/2017 218 (H) 100 - 199 mg/dL Final   Cholesterol Piccolo, Waived  Date Value Ref Range Status  09/07/2014 135 <200 mg/dL Final    Comment:                            Desirable                <200                         Borderline High      200- 239                         High                     >239          Failed - LDL in normal range and within 360 days    LDL Calculated  Date Value Ref Range Status  11/23/2017 132 (H) 0 - 99 mg/dL Final         Failed - Triglycerides in normal range and within 360 days    Triglycerides  Date Value Ref Range Status  11/23/2017 207 (H) 0 - 149 mg/dL Final   Triglycerides Piccolo,Waived  Date Value Ref Range Status  09/07/2014 107 <150 mg/dL Final    Comment:                            Normal                   <150                         Borderline High     150 - 199                         High                200 - 499                         Very High                >499          Passed - HDL in normal range and within 360 days    HDL  Date Value Ref Range Status  11/23/2017 45 >39 mg/dL Final         Passed - Valid encounter within last 12 months    Recent Outpatient Visits          2 weeks ago Dementia without behavioral disturbance, unspecified dementia type ([place])   [place], [name] T, NP   1 month ago Dementia without behavioral disturbance, unspecified dementia type ([place])   [place], Chester T,  NP   4 months ago Dementia without behavioral disturbance, unspecified  dementia type ([place])   [place], Barbaraann Faster, NP   8 months ago Need for pneumococcal vaccination   [facility] [place] Kathrine Haddock, NP   11 months ago Generalized abdominal or pelvic swelling or mass or lump   [facility], Lilia Argue, [place]      Future Appointments            In 2 weeks Cannady, Barbaraann Faster, NP [employer]   In 3 months  [employer], [place]

## 2018-04-08 NOTE — Telephone Encounter (Signed)
CCM team is aware and we are working on this, made him aware via telephone call this morning.

## 2018-04-08 NOTE — Telephone Encounter (Signed)
Called and spoke with [name].  Chronic Care Management team called Friday and had lengthy conversation with him.  They are working on getting help for him in the home and plan on touching base with him today or tomorrow.

## 2018-04-09 ENCOUNTER — Other Ambulatory Visit: Payer: Self-pay | Admitting: Nurse Practitioner

## 2018-04-09 ENCOUNTER — Other Ambulatory Visit: Payer: Self-pay

## 2018-04-09 ENCOUNTER — Ambulatory Visit: Payer: Medicare Other | Admitting: *Deleted

## 2018-04-09 DIAGNOSIS — Z7189 Other specified counseling: Secondary | ICD-10-CM

## 2018-04-09 DIAGNOSIS — F0390 Unspecified dementia without behavioral disturbance: Secondary | ICD-10-CM

## 2018-04-09 NOTE — Chronic Care Management (AMB) (Signed)
  Chronic Care Management   Follow Up Note   04/09/2018 Name: Christine Burch MRN: [id number] DOB: 1940/11/19  Referred by: Venita Lick, NP Reason for referral : Chronic Care Management (Palliative Care Referral)   Christine Burch is a 78 y.o. year old female who is a primary care patient of Cannady, Barbaraann Faster, NP. The CCM team was consulted for assistance with chronic disease management and care coordination needs.    Review of patient status, including review of consultants reports, relevant laboratory and other test results, and collaboration with appropriate care team members and the patient's provider was performed as part of comprehensive patient evaluation and provision of chronic care management services.    Goals Addressed    . "I could use help with taking care of her" (spouse) (pt-stated)       Current Barriers:  Marland Kitchen Knowledge Deficits related to available resources for assistance with dementia care. - outreach to patient's spouse/caregiver/DPR today to follow up on status and he requests referral to [place] so that he can meet with Hospice clinician to discuss services    Nurse Case Manager Clinical Goal(s):  Marland Kitchen Over the next 30 days, patient will meet with RN Care Manager to address dementia care support and resources  Interventions:  . Evaluation of current treatment plan related to dementia and general plan of care by PCP . Face to Face collaboration with PCP re: patient's spouse/caregiver request for Palliative/Hospice referral  . Self Care Activities:  . Currently unable to independently perform ADL's or IADL's* . Primary caregiver/spouse calls provider office for new concerns or questions . Attends all scheduled provider appointments (managed by spouse/primary caregiver)  Plan:  . RNCM will continue to collaborate with social work team and PCP to discuss options for dementia care, support and resources and will follow up with patient's spouse/primary  caregiver to assess for ongoing needs and changes; Follow up call scheduled for next 7 days   Please see past updates related to this goal by clicking on the "Past Updates" button in the selected goal       The CM team will reach out to the patient again over the next 7 days.    Janalyn Shy MHA,BSN,RN,CCM Nurse Care Coordinator [facility] Care Management [phone]

## 2018-04-09 NOTE — Patient Instructions (Signed)
Visit Information  Goals Addressed            This Visit's Progress   . "I could use help with taking care of her" (spouse) (pt-stated)       Current Barriers:  Marland Kitchen Knowledge Deficits related to available resources for assistance with dementia care. - outreach to patient's spouse/caregiver/DPR today to follow up on status and he requests referral to [place] so that he can meet with Hospice clinician to discuss services    Nurse Case Manager Clinical Goal(s):  Marland Kitchen Over the next 30 days, patient will meet with RN Care Manager to address dementia care support and resources  Interventions:  . Evaluation of current treatment plan related to dementia and general plan of care by PCP . Face to Face collaboration with PCP re: patient's spouse/caregiver request for Palliative/Hospice referral  . Self Care Activities:  . Currently unable to independently perform ADL's or IADL's* . Primary caregiver/spouse calls provider office for new concerns or questions . Attends all scheduled provider appointments (managed by spouse/primary caregiver)  Plan:  . RNCM will continue to collaborate with social work team and PCP to discuss options for dementia care, support and resources and will follow up with patient's spouse/primary caregiver to assess for ongoing needs and changes; Follow up call scheduled for next 7 days   Please see past updates related to this goal by clicking on the "Past Updates" button in the selected goal         The patient verbalized understanding of instructions provided today and declined a print copy of patient instruction materials.   The CM team will reach out to the patient again over the next 7 days.   Janalyn Shy MHA,BSN,RN,CCM Nurse Care Coordinator [facility] Care Management [phone]

## 2018-04-09 NOTE — Progress Notes (Signed)
Patient husband requesting hospice/palliative referral for patient.  Order placed.

## 2018-04-10 ENCOUNTER — Telehealth: Payer: Self-pay | Admitting: Nurse Practitioner

## 2018-04-10 ENCOUNTER — Ambulatory Visit: Payer: Self-pay | Admitting: *Deleted

## 2018-04-10 DIAGNOSIS — R269 Unspecified abnormalities of gait and mobility: Secondary | ICD-10-CM

## 2018-04-10 DIAGNOSIS — F0390 Unspecified dementia without behavioral disturbance: Secondary | ICD-10-CM

## 2018-04-10 DIAGNOSIS — Z7189 Other specified counseling: Secondary | ICD-10-CM

## 2018-04-10 NOTE — Telephone Encounter (Signed)
Copied from [place] [id number]. Topic: General - Other >> Apr 10, 2018  9:37 AM Leward Quan A wrote: Reason for CRM: Melissa with [facility] called to say that they received a referral for this patient and she called to set up a consultation with the family. Per Lenna Sciara Mr Koepke told her that Dr Ned Card is working with his wife in some other program. The concern is that she does not want to start care with the patient and her insurance denying it due to her being in multiple programs. She would like a call back From Dr Suezanne Jacquet nurse if possible to clarify and get patient started on the help they provide. Please call Melissa at Ph# [phone]

## 2018-04-10 NOTE — Chronic Care Management (AMB) (Signed)
  Chronic Care Management   Follow Up Note   04/10/2018 Name: Christine Burch MRN: [id number] DOB: 1940-09-17  Referred by: Venita Lick, NP Reason for referral : Care Coordination (f/u Hospice/Palliative Referral)   [name] is a 78 y.o. year old female who is a primary care patient of Cannady, Barbaraann Faster, NP. The CCM team was consulted for assistance with chronic disease management and care coordination needs.    Review of patient status, including review of consultants reports, relevant laboratory and other test results, and collaboration with appropriate care team members and the patient's provider was performed as part of comprehensive patient evaluation and provision of chronic care management services.    Goals Addressed    . "I could use help with taking care of her" (spouse) (pt-stated)       Current Barriers:  Marland Kitchen Knowledge Deficits related to available resources for assistance with dementia care. - outreach to patient's spouse/caregiver/DPR yesterday to assist with referral to [facility] so that he can meet with Hospice clinician to discuss services    Nurse Case Manager Clinical Goal(s):  Marland Kitchen Over the next 30 days, patient/spouse & caregiver will meet with RN Care Manager to address dementia care support and resources  Interventions:  . Evaluation of current treatment plan related to dementia and general plan of care by PCP  Face to Face collaboration with PCP progress with Palliative/Hospice referral  Follow Up call to patient re: plans for palliative/hospice intake visit today (Melissa RN)  . Self Care Activities:  . Currently unable to independently perform ADL's or IADL's . Primary caregiver/spouse calls provider office for new concerns or questions . Attends all scheduled provider appointments (managed by spouse/primary caregiver)  Plan:  RNCM will continue to collaborate with social work team, PCP, and patient's spouse/primary caregiver re:  initiation of hospice/palliative care services.  Please see past updates related to this goal by clicking on the "Past Updates" button in the selected goal      The CM team will reach out to the patient again over the next 10 days.   Janalyn Shy MHA,BSN,RN,CCM Nurse Care Coordinator [facility] Care Management [phone]

## 2018-04-10 NOTE — Patient Instructions (Signed)
Visit Information  Goals Addressed    . "I could use help with taking care of her" (spouse) (pt-stated)       Current Barriers:  Marland Kitchen Knowledge Deficits related to available resources for assistance with dementia care. - outreach to patient's spouse/caregiver/DPR yesterday to assist with referral to [facility] so that he can meet with Hospice clinician to discuss services    Nurse Case Manager Clinical Goal(s):  Marland Kitchen Over the next 30 days, patient/spouse & caregiver will meet with RN Care Manager to address dementia care support and resources  Interventions:  . Evaluation of current treatment plan related to dementia and general plan of care by PCP  Face to Face collaboration with PCP progress with Palliative/Hospice referral  Follow Up call to patient re: plans for palliative/hospice intake visit today (Melissa RN)  . Self Care Activities:  . Currently unable to independently perform ADL's or IADL's . Primary caregiver/spouse calls provider office for new concerns or questions . Attends all scheduled provider appointments (managed by spouse/primary caregiver)  Plan:  RNCM will continue to collaborate with social work team, PCP, and patient's spouse/primary caregiver re: initiation of hospice/palliative care services.  Please see past updates related to this goal by clicking on the "Past Updates" button in the selected goal        Handwashing is one of the best ways to protect yourself and your family from getting sick. Wash Your Hands Often to Stay Healthy  When: You can help yourself and your loved ones stay healthy by washing your hands often, especially during these key times when you are likely to get and spread germs: . Before, during, and after preparing food  . Before eating food  . Before and after caring for someone at home who is sick with vomiting or diarrhea  . Before and after treating a cut or wound  . After using the toilet  . After being in public  spaces . After changing diapers or cleaning up a child who has used the toilet  . After blowing your nose, coughing, or sneezing  . After touching an animal, animal feed, or animal waste  . After handling pet food or pet treats  . After touching garbage . After touching public doors, telephones, shopping carts, or other surfaces .  Five Steps to [place] the Right [address] 1. Wet your hands with clean, running water (warm or cold), turn off the tap, and apply soap. 2. Lather your hands by rubbing them together with the soap. Lather the backs of your hands, between your fingers, and under your nails. 3. Scrub your hands for at least 20 seconds. Need a timer? Hum the "Happy Birthday" song from beginning to end twice. 4. Rinse your hands well under clean, running water. 5. Dry your hands using a clean towel or air dry them.  Source: [employer]    The patient verbalized understanding of instructions provided today and declined a print copy of patient instruction materials.   The CM team will reach out to the patient again over the next 10 days.   Janalyn Shy MHA,BSN,RN,CCM Nurse Care Coordinator [facility] / [facility] Care Management [phone]

## 2018-04-10 NOTE — Telephone Encounter (Signed)
I spoke with patient's spouse/primary caregiver and explained collaboration and process between Hospice/Palliative Care team and patient's medical home, [facility]. He said he understood better and was looking forward to [facility] [place] visiting today. I advised that he give my contact into to [facility] so we could collaborate further as needed.

## 2018-04-10 NOTE — Telephone Encounter (Signed)
Spoke to [place] with hospice.  She reports since patient is seeing clinic and with CCM crew that insurance may not cover hospice in home.  Will pass this information onto CCM team.

## 2018-04-10 NOTE — Telephone Encounter (Signed)
Wonderful. Thank you.

## 2018-04-11 NOTE — Telephone Encounter (Signed)
Melissa calling and states that she would like a call back from nurse regarding atient and referrals. Please advise   [phone]

## 2018-04-11 NOTE — Telephone Encounter (Signed)
Spoke with Lenna Sciara, will have Dr. Wynetta Emery sign hospice referral on return to office.

## 2018-04-12 ENCOUNTER — Ambulatory Visit: Payer: Self-pay | Admitting: Family Medicine

## 2018-04-12 ENCOUNTER — Ambulatory Visit: Payer: Self-pay | Admitting: Nurse Practitioner

## 2018-04-12 DIAGNOSIS — F0390 Unspecified dementia without behavioral disturbance: Secondary | ICD-10-CM | POA: Diagnosis not present

## 2018-04-12 DIAGNOSIS — J189 Pneumonia, unspecified organism: Secondary | ICD-10-CM | POA: Diagnosis not present

## 2018-04-12 DIAGNOSIS — R0602 Shortness of breath: Secondary | ICD-10-CM | POA: Diagnosis not present

## 2018-04-12 DIAGNOSIS — Z7982 Long term (current) use of aspirin: Secondary | ICD-10-CM | POA: Diagnosis not present

## 2018-04-12 DIAGNOSIS — I2510 Atherosclerotic heart disease of native coronary artery without angina pectoris: Secondary | ICD-10-CM | POA: Diagnosis not present

## 2018-04-12 NOTE — Telephone Encounter (Signed)
My wife has a fever and is coughing.   She slept all day yesterday and most of today, per husband Lamija Besse.  Reason for Disposition . [1] Fever > 100.0 F (37.8 C) AND [2] bedridden (e.g., nursing home patient, CVA, chronic illness, recovering from surgery)  Answer Assessment - Initial Assessment Questions 1. TEMPERATURE: "What is the most recent temperature?"  "How was it measured?"      Husband, [name] called in for wife.   She has dementia bad.   She is not talking much or opening her eyes.   She has had a fever and a cough.   She is not eating anything.    She did   100.9 temperature orally. 2. ONSET: "When did the fever start?"      97.5 last night.   Today it's 100.9   3. SYMPTOMS: "Do you have any other symptoms besides the fever?"  (e.g., colds, headache, sore throat, earache, cough, rash, diarrhea, vomiting, abdominal pain)     Dry cough.   Last night when brushed her teeth (it's hard to get her to open her mouth)   She spit up bile colored stuff.   She can't tell me how she feels.   No diarrhea   Poor appetite.   Ate good last night but this morning she doesn't want to eat and wants to just sleep.   4. CAUSE: If there are no symptoms, ask: "What do you think is causing the fever?"      I can't get enough water in her.   She's incontinent.   She does pee at night and I change her pad.    5. CONTACTS: "Does anyone else in the family have an infection?"     No one we know of.    We went out to eat Sunday night with several people.  6. TREATMENT: "What have you done so far to treat this fever?" (e.g., medications)     Nothing. 7. 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    8. PREGNANCY: "Is there any chance you are pregnant?" "When was your last menstrual period?"     N/A due to age. 9. TRAVEL: "Have you traveled out of the country in the  last month?" (e.g., travel history, exposures)     No travels  Protocols used: FEVER-A-AH

## 2018-04-12 NOTE — Telephone Encounter (Signed)
Spoke to Mr. Sliwa via telephone.  We discussed at length patient's symptoms.  Due to severe dementia and risk for aspiration PNA, as he reports she has been fatigued and he is still trying to feed her, I have recommended ER visit to [facility] near them.  Discussed at length thinking about goals of care for patient and reviewed the three types of life support + risks/benefits of each.  He reported he would "not want those", but would agree to IV abx if needed.  Have recommended ER in case IV treatment needed based on patient current symptoms and risks.

## 2018-04-15 ENCOUNTER — Ambulatory Visit: Payer: Self-pay | Admitting: Nurse Practitioner

## 2018-04-15 ENCOUNTER — Encounter: Payer: Self-pay | Admitting: Nurse Practitioner

## 2018-04-15 NOTE — Progress Notes (Signed)
error 

## 2018-04-15 NOTE — Telephone Encounter (Signed)
Spoke to [place] via telephone.  Discussed plan of care.  He wishes Stefan to stay at home and is giving her tylenol.  Am trying to work with hospice to get them in ASAP.

## 2018-04-15 NOTE — Telephone Encounter (Signed)
Christine Burch is calling to report he took wife to hospital ED- Friday and she was diagnosed with pneumonia. She is presently taking taking 2 antibiotics. Husband is concerned about her temperature. Patient is sleeping and resting. Husband is not treating fever at this time. He is requesting call for advisement from PCP- did advise him to treat fever with Tylenol.   Reason for Disposition . [1] Taking antibiotic > 48 hours (2 days) for pneumonia AND [2] fever persists or recurs    Husband is doing follow up call to PCP- is requesting call back. Does not want to bring wife to office if he does not have to.  Answer Assessment - Initial Assessment Questions 1. SYMPTOMS: "What symptoms are you most concerned about?" "Is it better, the same, or worse compared to when you saw the doctor?"     Fever- about the same 2. BREATHING DIFFICULTY: "Are you having any difficulty breathing?" If so, ask "How bad is it?"  (e.g., none, mild, moderate, severe)   - MILD: No SOB at rest, mild SOB with walking, speaks normally in sentences, can lay down, no retractions, pulse < 100.    - MODERATE: SOB at rest, SOB with minimal exertion and prefers to sit, cannot lie down flat, speaks in phrases, mild retractions, audible wheezing, pulse 100-120.    - SEVERE: Very SOB at rest, speaks in single words, struggling to breathe, sitting hunched forward, retractions, pulse > 120      Breathing is still SOB- reported same 3. FEVER: "Do you have a fever?" If so, ask: "What is your temperature, how was it measured, and when did it start?"     101- now- oral thermometer this week end 4. SPUTUM: "Describe the color of your sputum" (clear, white, yellow, green, blood-tinged)     Coughs only when eating or drinking 5. DIAGNOSIS CONFIRMATION: "When was the pneumonia diagnosed?" "By whom?"     X-ray at ED 6. ANTIBIOTIC: "Are you taking an antibiotic?"  If so, "Which one?" "When was it started?"     Azithromycin and Amoxicillian  started  Friday night 7. OTHER TREATMENT: "Are you receiving any other treatment for the pneumonia?" (e.g., albuterol nebulizer, oxygen) If so, ask, "How often?" and "Do they help?"     no 8. HOSPITAL ADMISSION: "Were you hospitalized for this pneumonia?" If so, ask "When were you discharged home from the hospital?"     no  Protocols used: PNEUMONIA ON ANTIBIOTIC FOLLOW-UP CALL-A-AH

## 2018-04-16 ENCOUNTER — Ambulatory Visit: Payer: Medicare Other | Admitting: *Deleted

## 2018-04-16 ENCOUNTER — Other Ambulatory Visit: Payer: Self-pay

## 2018-04-16 ENCOUNTER — Telehealth: Payer: Self-pay

## 2018-04-16 DIAGNOSIS — K219 Gastro-esophageal reflux disease without esophagitis: Secondary | ICD-10-CM | POA: Diagnosis not present

## 2018-04-16 DIAGNOSIS — I10 Essential (primary) hypertension: Secondary | ICD-10-CM | POA: Diagnosis not present

## 2018-04-16 DIAGNOSIS — N189 Chronic kidney disease, unspecified: Secondary | ICD-10-CM | POA: Diagnosis not present

## 2018-04-16 DIAGNOSIS — R636 Underweight: Secondary | ICD-10-CM | POA: Diagnosis not present

## 2018-04-16 DIAGNOSIS — F0390 Unspecified dementia without behavioral disturbance: Secondary | ICD-10-CM

## 2018-04-16 DIAGNOSIS — R269 Unspecified abnormalities of gait and mobility: Secondary | ICD-10-CM | POA: Diagnosis not present

## 2018-04-16 DIAGNOSIS — Z7189 Other specified counseling: Secondary | ICD-10-CM

## 2018-04-16 DIAGNOSIS — I739 Peripheral vascular disease, unspecified: Secondary | ICD-10-CM | POA: Diagnosis not present

## 2018-04-16 DIAGNOSIS — I672 Cerebral atherosclerosis: Secondary | ICD-10-CM | POA: Diagnosis not present

## 2018-04-16 DIAGNOSIS — F0151 Vascular dementia with behavioral disturbance: Secondary | ICD-10-CM | POA: Diagnosis not present

## 2018-04-16 NOTE — Chronic Care Management (AMB) (Signed)
  Chronic Care Management   Follow Up Note   04/16/2018 Name: Christine Burch MRN: [id number] DOB: May 27, 1940  Referred by: Venita Lick, NP Reason for referral : Chronic Care Management (Telephone f/u for hospice services)   Christine Burch is a 78 y.o. year old female who is a primary care patient of Cannady, Barbaraann Faster, NP. The CCM team was consulted for assistance with chronic disease management and care coordination needs.  Spoke with Christine Burch at [facility], Christine Burch patient's spouse and PCP in regards to Hospice services.    Review of patient status, including review of consultants reports, relevant laboratory and other test results, and collaboration with appropriate care team members and the patient's provider was performed as part of comprehensive patient evaluation and provision of chronic care management services.    Goals Addressed    . "I could use help with taking care of her" (spouse) (pt-stated)       Current Barriers:  Marland Kitchen Knowledge Deficits related to available resources for assistance with dementia care. -Spoke with Christine Burch at [facility] and she confirmed patient will be admitted to hospice at home services today at 11am.  . Spoke with Christine Burch (spouse), he stated he was very thankful Hospice was coming and he voiced understanding of hospice services.    Nurse Case Manager Clinical Goal(s):  Marland Kitchen Over the next 14 days, patient/spouse & caregiver will speak with RN Care Manager to confirm Hospice Services are continuing to be enough support for patient and caregiver.   Interventions:   Follow Up call to patient/caregiver re: Caring for himself also during this time, confirmed he had meals, transportation and family/community support.   Provided emotional support and listening for caregiver.   Made PCP aware Hospice services to start today.  Self Care Activities:  . Currently unable to independently perform ADL's or IADL's . Primary  caregiver/spouse calls provider office for new concerns or questions . Attends all scheduled provider appointments (managed by spouse/primary caregiver)  Plan:  RNCM will continue to collaborate with social work team, PCP, and patient's spouse/primary caregiver re: initiation of hospice/palliative care services.( Services to begin today)  Please see past updates related to this goal by clicking on the "Past Updates" button in the selected goal        The CM team will reach out to the patient's caregiver again over the next 14 days.  The patient has been provided with contact information for the chronic care management team and has been advised to call with any health related questions or concerns.    Christine Morse Christyne Mccain RN, BSN Nurse Case Editor, commissioning Family Practice/[facility] Care Management  [phone]) Business [employer]

## 2018-04-16 NOTE — Patient Instructions (Signed)
Visit Information  Goals Addressed            This Visit's Progress   . "I could use help with taking care of her" (spouse) (pt-stated)       Current Barriers:  Marland Kitchen Knowledge Deficits related to available resources for assistance with dementia care. -Spoke with Melissa at [facility] and she confirmed patient will be admitted to hospice at home services today at 11am.  . Spoke with Mr. Seaira Byus (spouse), he stated he was very thankful Hospice was coming and he voiced understanding of hospice services.    Nurse Case Manager Clinical Goal(s):  Marland Kitchen Over the next 14 days, patient/spouse & caregiver will speak with RN Care Manager to confirm Hospice Services are continuing to be enough support for patient and caregiver.   Interventions:   Follow Up call to patient/caregiver re: Caring for himself also during this time, confirmed he had meals, transportation and family/community support.   Provided emotional support and listening for caregiver.   Made PCP aware Hospice services to start today.  Self Care Activities:  . Currently unable to independently perform ADL's or IADL's . Primary caregiver/spouse calls provider office for new concerns or questions . Attends all scheduled provider appointments (managed by spouse/primary caregiver)  Plan:  RNCM will continue to collaborate with social work team, PCP, and patient's spouse/primary caregiver re: initiation of hospice/palliative care services.( Services to begin today)  Please see past updates related to this goal by clicking on the "Past Updates" button in the selected goal         The patient's caregiver verbalized understanding of instructions provided today and declined a print copy of patient instruction materials.   The CM team will reach out to the patient again over the next 14 days.  The patient has been provided with contact information for the chronic care management team and has been advised to call with any  health related questions or concerns.   Merlene Morse Malasha Kleppe RN, BSN Nurse Case Editor, commissioning Family Practice/[facility] Care [facility]  [phone]) Business [employer]

## 2018-04-17 ENCOUNTER — Inpatient Hospital Stay: Payer: Medicare Other | Admitting: Nurse Practitioner

## 2018-04-17 ENCOUNTER — Telehealth: Payer: Self-pay

## 2018-04-17 DIAGNOSIS — I739 Peripheral vascular disease, unspecified: Secondary | ICD-10-CM | POA: Diagnosis not present

## 2018-04-17 DIAGNOSIS — K219 Gastro-esophageal reflux disease without esophagitis: Secondary | ICD-10-CM | POA: Diagnosis not present

## 2018-04-17 DIAGNOSIS — R269 Unspecified abnormalities of gait and mobility: Secondary | ICD-10-CM | POA: Diagnosis not present

## 2018-04-17 DIAGNOSIS — I672 Cerebral atherosclerosis: Secondary | ICD-10-CM | POA: Diagnosis not present

## 2018-04-17 DIAGNOSIS — F0151 Vascular dementia with behavioral disturbance: Secondary | ICD-10-CM | POA: Diagnosis not present

## 2018-04-17 DIAGNOSIS — N189 Chronic kidney disease, unspecified: Secondary | ICD-10-CM | POA: Diagnosis not present

## 2018-04-18 DIAGNOSIS — I672 Cerebral atherosclerosis: Secondary | ICD-10-CM | POA: Diagnosis not present

## 2018-04-18 DIAGNOSIS — K219 Gastro-esophageal reflux disease without esophagitis: Secondary | ICD-10-CM | POA: Diagnosis not present

## 2018-04-18 DIAGNOSIS — R269 Unspecified abnormalities of gait and mobility: Secondary | ICD-10-CM | POA: Diagnosis not present

## 2018-04-18 DIAGNOSIS — N189 Chronic kidney disease, unspecified: Secondary | ICD-10-CM | POA: Diagnosis not present

## 2018-04-18 DIAGNOSIS — I739 Peripheral vascular disease, unspecified: Secondary | ICD-10-CM | POA: Diagnosis not present

## 2018-04-18 DIAGNOSIS — F0151 Vascular dementia with behavioral disturbance: Secondary | ICD-10-CM | POA: Diagnosis not present

## 2018-04-19 ENCOUNTER — Ambulatory Visit: Payer: Medicare Other | Admitting: Nurse Practitioner

## 2018-04-19 DIAGNOSIS — F0151 Vascular dementia with behavioral disturbance: Secondary | ICD-10-CM | POA: Diagnosis not present

## 2018-04-19 DIAGNOSIS — I739 Peripheral vascular disease, unspecified: Secondary | ICD-10-CM | POA: Diagnosis not present

## 2018-04-19 DIAGNOSIS — K219 Gastro-esophageal reflux disease without esophagitis: Secondary | ICD-10-CM | POA: Diagnosis not present

## 2018-04-19 DIAGNOSIS — N189 Chronic kidney disease, unspecified: Secondary | ICD-10-CM | POA: Diagnosis not present

## 2018-04-19 DIAGNOSIS — R269 Unspecified abnormalities of gait and mobility: Secondary | ICD-10-CM | POA: Diagnosis not present

## 2018-04-19 DIAGNOSIS — I672 Cerebral atherosclerosis: Secondary | ICD-10-CM | POA: Diagnosis not present

## 2018-04-20 DIAGNOSIS — I672 Cerebral atherosclerosis: Secondary | ICD-10-CM | POA: Diagnosis not present

## 2018-04-20 DIAGNOSIS — N189 Chronic kidney disease, unspecified: Secondary | ICD-10-CM | POA: Diagnosis not present

## 2018-04-20 DIAGNOSIS — K219 Gastro-esophageal reflux disease without esophagitis: Secondary | ICD-10-CM | POA: Diagnosis not present

## 2018-04-20 DIAGNOSIS — F0151 Vascular dementia with behavioral disturbance: Secondary | ICD-10-CM | POA: Diagnosis not present

## 2018-04-20 DIAGNOSIS — R269 Unspecified abnormalities of gait and mobility: Secondary | ICD-10-CM | POA: Diagnosis not present

## 2018-04-20 DIAGNOSIS — I739 Peripheral vascular disease, unspecified: Secondary | ICD-10-CM | POA: Diagnosis not present

## 2018-04-21 DIAGNOSIS — N189 Chronic kidney disease, unspecified: Secondary | ICD-10-CM | POA: Diagnosis not present

## 2018-04-21 DIAGNOSIS — F0151 Vascular dementia with behavioral disturbance: Secondary | ICD-10-CM | POA: Diagnosis not present

## 2018-04-21 DIAGNOSIS — I739 Peripheral vascular disease, unspecified: Secondary | ICD-10-CM | POA: Diagnosis not present

## 2018-04-21 DIAGNOSIS — R269 Unspecified abnormalities of gait and mobility: Secondary | ICD-10-CM | POA: Diagnosis not present

## 2018-04-21 DIAGNOSIS — K219 Gastro-esophageal reflux disease without esophagitis: Secondary | ICD-10-CM | POA: Diagnosis not present

## 2018-04-21 DIAGNOSIS — I672 Cerebral atherosclerosis: Secondary | ICD-10-CM | POA: Diagnosis not present

## 2018-04-24 DEATH — deceased

## 2018-04-25 ENCOUNTER — Ambulatory Visit: Payer: Medicare Other | Admitting: Licensed Clinical Social Worker

## 2018-04-25 DIAGNOSIS — F0390 Unspecified dementia without behavioral disturbance: Secondary | ICD-10-CM

## 2018-04-25 DIAGNOSIS — N182 Chronic kidney disease, stage 2 (mild): Secondary | ICD-10-CM

## 2018-04-25 NOTE — Chronic Care Management (AMB) (Signed)
  Chronic Care Management    Clinical Social Work General Note  04/25/2018 Name: Christine Burch MRN: [id number] DOB: June 27, 1940  Christine Burch is a 78 y.o. year old female who is a primary care patient of Cannady, Barbaraann Faster, NP. The CCM was consulted to assist the patient with Hospice/Palliative Care Services Education. LCSW completed outreach to patient's residence on 04/25/2018 to provide social work support and spouse answered. HIPPA verificaitons received. Spouse confirmed that patient passed away on 05-06-18 under hospice care/supervision. LCSW expressed her sincere condolences for this loss. Patient wanted to express his gratitude to patient's PCP and each CFP provider that assisted him with initiating Hospice services while providing him emotional support and community resource education. Patient confirms that he is doing well and at peace with patient's passing. He shares that at this time he is well and has meals, transportation and family support. LCSW provided education on grief support resources within the area. Patient denies needing grief counseling at this time but wrote down contact information as well as LCSW's direct number in the case that he needs to gain any type of case management services or support.  Review of patient/spouse status, including review of consultants reports, relevant laboratory and other test results, and collaboration with appropriate care team members and the patient's provider was performed as part of comprehensive patient evaluation and provision of chronic care management services.    Follow Up Plan: Client's spouse will reach out to LCSW once social work or case management needs arise. LCSW will update PCP and CCM team.    Christine Burch, BSW, MSW, [email or web address] Phone: [phone]

## 2018-04-30 ENCOUNTER — Telehealth: Payer: Self-pay

## 2018-06-24 IMAGING — CT CT ABDOMEN W/ CM
2 of 5 series · 15 of 46 positions shown, 17 images · IV contrast (APPLIED)
Comparison: 10/02/2014

CLINICAL DATA: Followup GI stromal tumor.  Previous surgery .

EXAM:
CT ABDOMEN WITH CONTRAST
TECHNIQUE: Multidetector CT imaging of the abdomen was performed using the
standard protocol following bolus administration of intravenous
contrast.
CONTRAST:  75mL [id number]-EVV IOPAMIDOL ([id number]-EVV) INJECTION 61%

[Series 2: routine abd/pel with · axial · 0.65mm/px · z∈[-762,-597]mm · 12 of 41 slices shown, 14 images]
[im 4/41  soft-tissue]
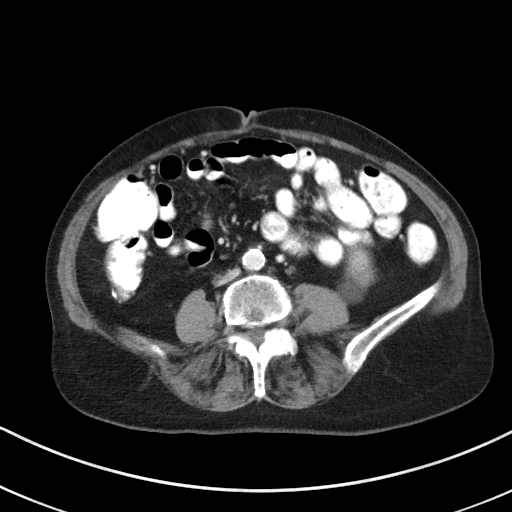
[im 4/41  bone]
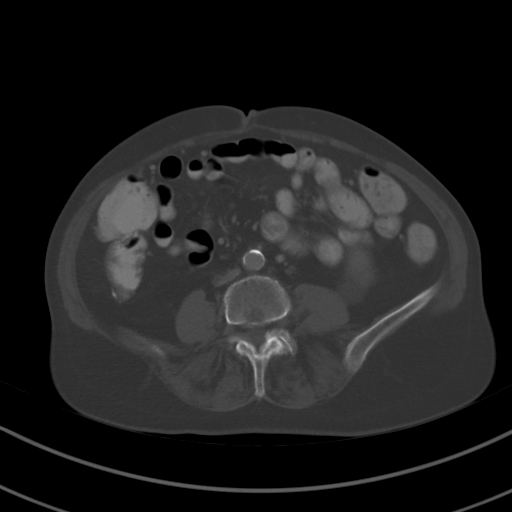
[im 7/41  soft-tissue]
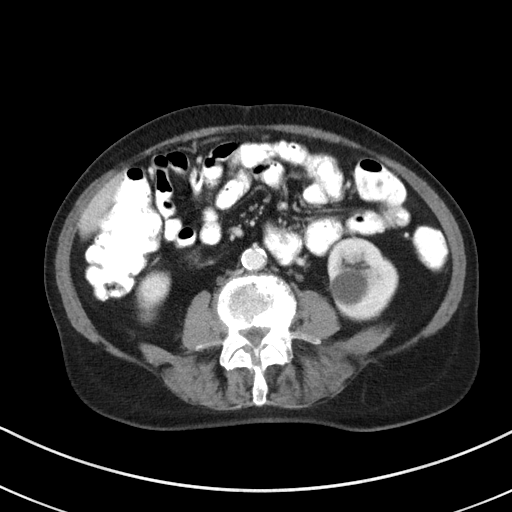
[im 10/41  soft-tissue]
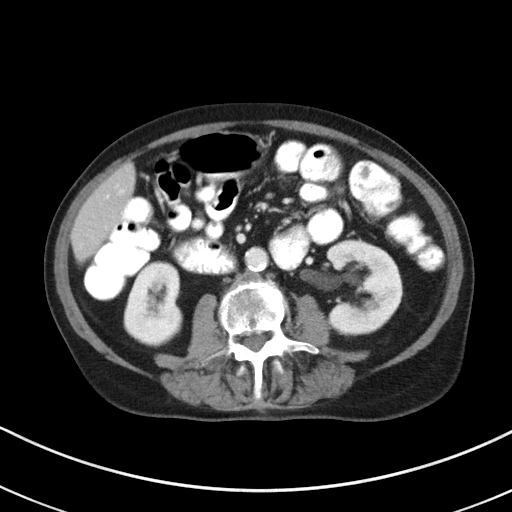
[im 13/41  soft-tissue]
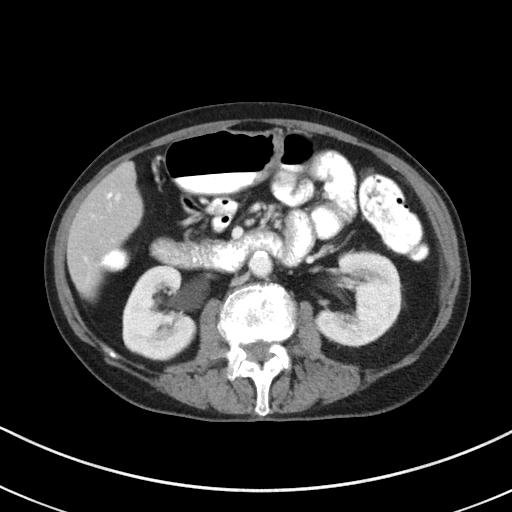
[im 16/41  soft-tissue]
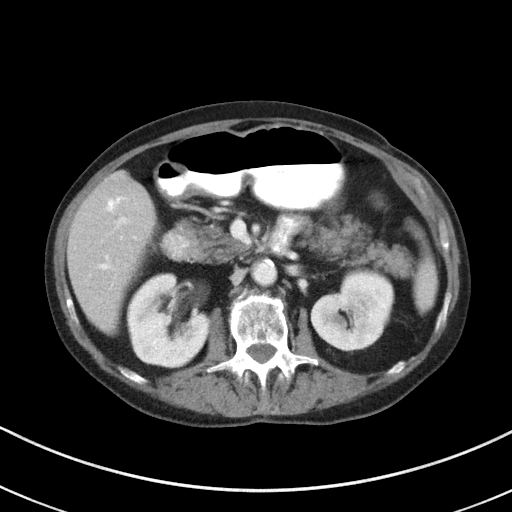
[im 19/41  soft-tissue]
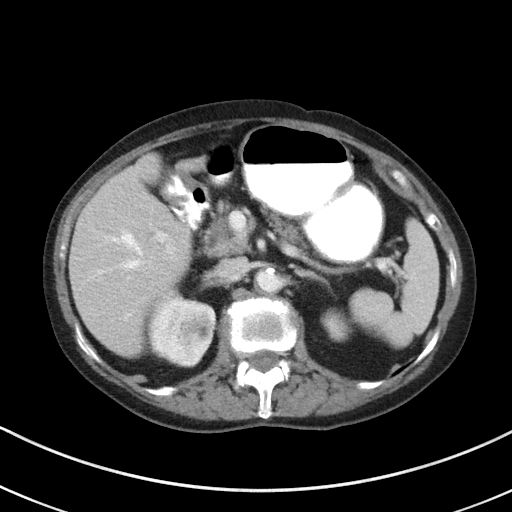
[im 22/41  soft-tissue]
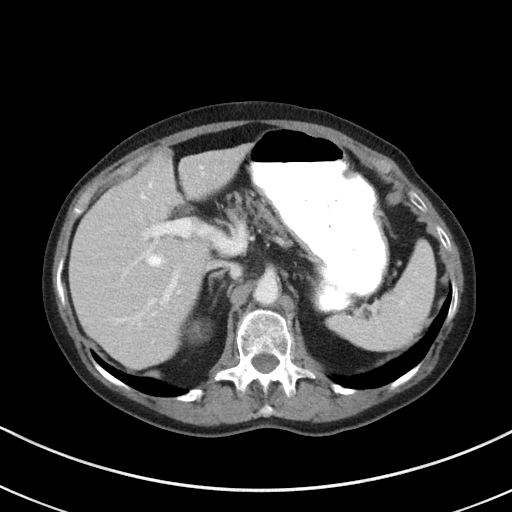
[im 25/41  soft-tissue]
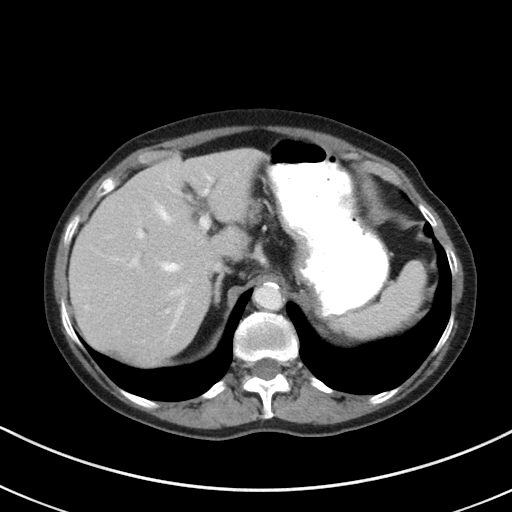
[im 28/41  soft-tissue]
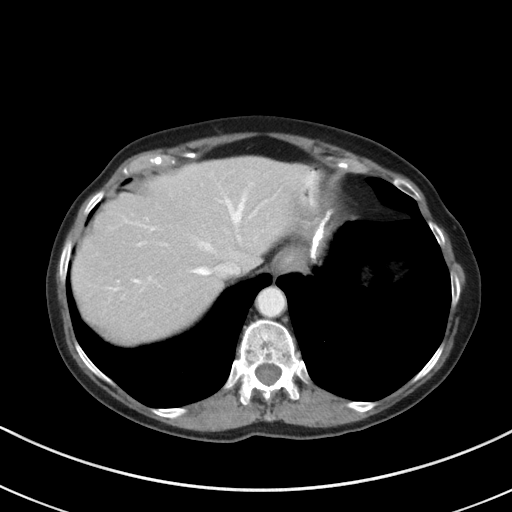
[im 28/41  bone]
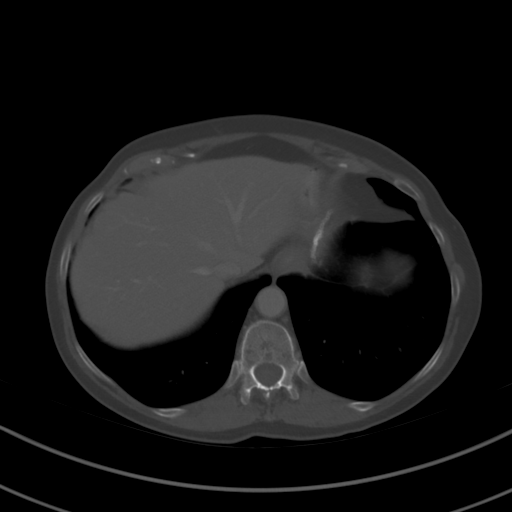
[im 31/41  soft-tissue]
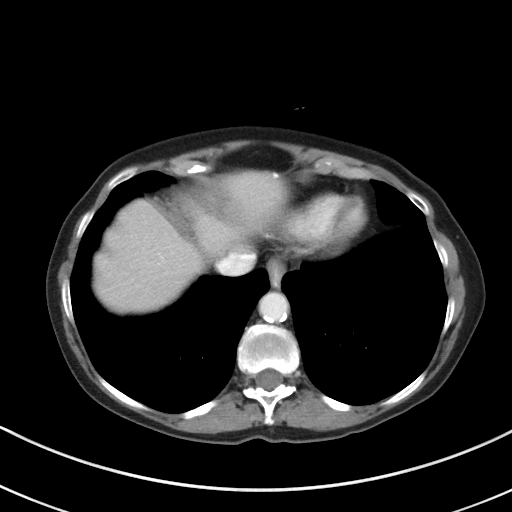
[im 34/41  soft-tissue]
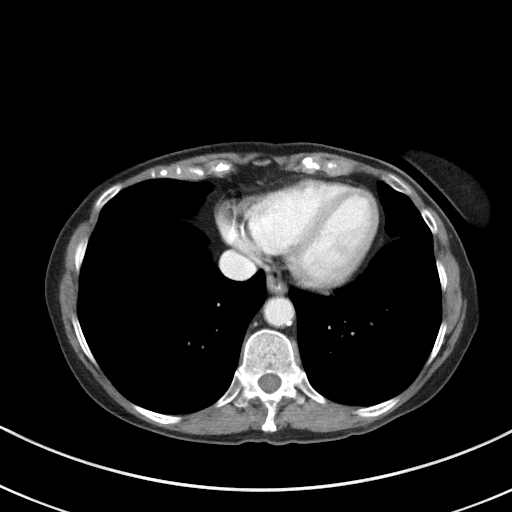
[im 37/41  soft-tissue]
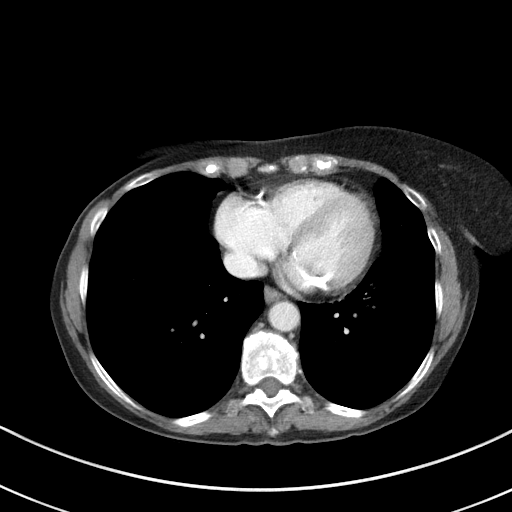

[Series 5: coronal st · coronal · 0.40mm/px · 3 of 87 slices shown]
[im 29/87  soft-tissue]
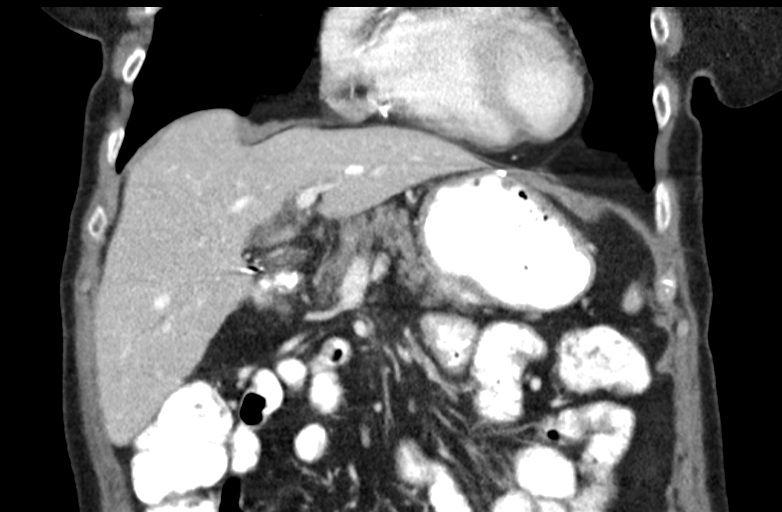
[im 39/87  soft-tissue]
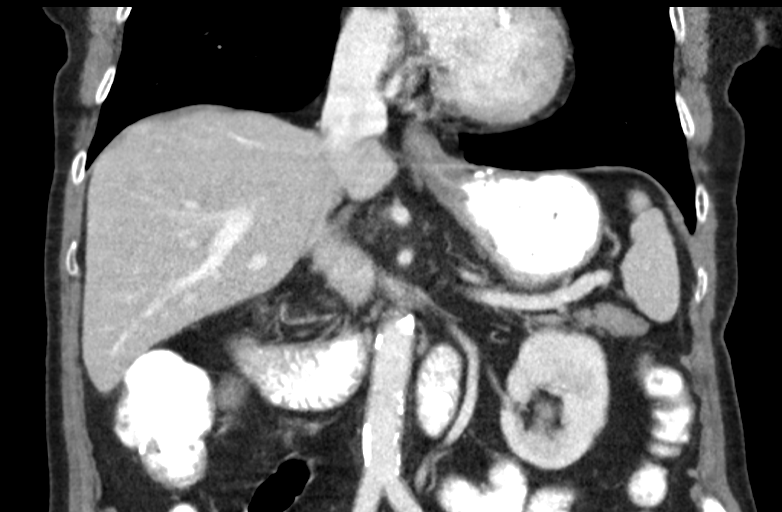
[im 48/87  soft-tissue]
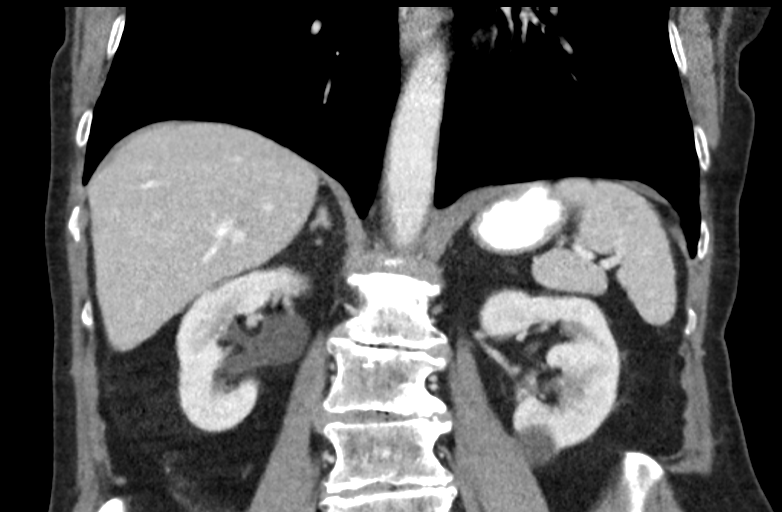

[15 of 46 positions shown; findings below may reference images not displayed]

FINDINGS: Lower chest: No acute findings.

Hepatobiliary: No masses identified. Prior cholecystectomy noted. No
evidence of biliary dilatation.

Pancreas:  No mass or inflammatory changes.

Spleen:  Within normal limits in size and appearance.

Adrenals/Urinary Tract: Stable small benign-appearing bilateral
renal cysts. No masses identified. No evidence of hydronephrosis.

Stomach/Bowel: Surgical staples seen at site of previously
demonstrated small mass in the proximal stomach. Visualized
abdominal bowel loops are otherwise unremarkable.

Vascular/Lymphatic: No pathologically enlarged lymph nodes
identified. No abdominal aortic aneurysm. Aortic atherosclerosis.

Other:  None.

Musculoskeletal:  No suspicious bone lesions identified.
IMPRESSION: No acute findings. No evidence of recurrent or metastatic tumor
within the abdomen.

Aortic atherosclerosis.

## 2018-08-01 ENCOUNTER — Ambulatory Visit: Payer: Medicare Other

## 2018-10-25 ENCOUNTER — Ambulatory Visit (INDEPENDENT_AMBULATORY_CARE_PROVIDER_SITE_OTHER): Payer: Medicare Other | Admitting: Vascular Surgery

## 2018-10-25 ENCOUNTER — Encounter (INDEPENDENT_AMBULATORY_CARE_PROVIDER_SITE_OTHER): Payer: Medicare Other
